# Patient Record
Sex: Male | Born: 1970 | Race: Black or African American | Hispanic: No | Marital: Single | Smoking: Current every day smoker
Health system: Southern US, Community
[De-identification: ages and names within clinical notes are randomized; demographics above are authoritative.]

## PROBLEM LIST (undated history)

## (undated) DIAGNOSIS — Z9359 Other cystostomy status: Secondary | ICD-10-CM

## (undated) DIAGNOSIS — L7682 Other postprocedural complications of skin and subcutaneous tissue: Secondary | ICD-10-CM

## (undated) DIAGNOSIS — F1911 Other psychoactive substance abuse, in remission: Secondary | ICD-10-CM

## (undated) DIAGNOSIS — R339 Retention of urine, unspecified: Secondary | ICD-10-CM

## (undated) DIAGNOSIS — S3730XA Unspecified injury of urethra, initial encounter: Secondary | ICD-10-CM

---

## 1998-02-04 HISTORY — PX: ORCHIECTOMY: SHX2116

## 2002-06-29 ENCOUNTER — Emergency Department (HOSPITAL_COMMUNITY): Admission: EM | Admit: 2002-06-29 | Discharge: 2002-06-29 | Payer: Self-pay | Admitting: Emergency Medicine

## 2003-08-17 ENCOUNTER — Emergency Department (HOSPITAL_COMMUNITY): Admission: EM | Admit: 2003-08-17 | Discharge: 2003-08-18 | Payer: Self-pay | Admitting: Emergency Medicine

## 2008-06-11 ENCOUNTER — Emergency Department (HOSPITAL_BASED_OUTPATIENT_CLINIC_OR_DEPARTMENT_OTHER): Admission: EM | Admit: 2008-06-11 | Discharge: 2008-06-11 | Payer: Self-pay | Admitting: Emergency Medicine

## 2015-03-28 ENCOUNTER — Emergency Department
Admission: EM | Admit: 2015-03-28 | Discharge: 2015-03-28 | Disposition: A | Discharge status: Home / Self Care | Payer: Self-pay | Attending: Emergency Medicine | Admitting: Emergency Medicine

## 2015-03-28 ENCOUNTER — Encounter: Payer: Self-pay | Admitting: Emergency Medicine

## 2015-03-28 DIAGNOSIS — F17200 Nicotine dependence, unspecified, uncomplicated: Secondary | ICD-10-CM | POA: Insufficient documentation

## 2015-03-28 DIAGNOSIS — N390 Urinary tract infection, site not specified: Secondary | ICD-10-CM | POA: Insufficient documentation

## 2015-03-28 LAB — URINALYSIS COMPLETE WITH MICROSCOPIC (ARMC ONLY)
Bilirubin Urine: NEGATIVE
Glucose, UA: NEGATIVE mg/dL
Hgb urine dipstick: NEGATIVE
Ketones, ur: NEGATIVE mg/dL
Nitrite: NEGATIVE
Protein, ur: NEGATIVE mg/dL
Specific Gravity, Urine: 1.024 (ref 1.005–1.030)
pH: 6 (ref 5.0–8.0)

## 2015-03-28 MED ORDER — SULFAMETHOXAZOLE-TRIMETHOPRIM 800-160 MG PO TABS: 1.0000 | ORAL_TABLET | Freq: Two times a day (BID) | ORAL | Status: DC

## 2015-03-28 NOTE — ED Notes (Signed)
Pt to ed from [facility].  States he was told by staff there he had to come her to be evaluated due to bacteria in his urine.  Pt denies all symptoms.

## 2015-03-28 NOTE — ED Provider Notes (Signed)
[facility] Emergency Department Provider Note  ____________________________________________  Time seen: Approximately 10:54 AM  I have reviewed the triage vital signs and the nursing notes.   HISTORY  Chief Complaint pt states he was told he has bacteria in his urine     HPI Francisco Ballard is a 45 y.o. male patient is a resident [facility]. Patient state he was told by staff there is bacteria in his urine. Patient denies any urinary complaints. Patient denies any penial discharge. Patient denies any fever associated this complaint. No palliative measures taken for this complaint. Patient states this been a history of urinary tract infection in the past.   Past Medical History  Diagnosis Date  . Heroin abuse     There are no active problems to display for this patient.   History reviewed. No pertinent past surgical history.  No current outpatient prescriptions on file.  Allergies Review of patient's allergies indicates no known allergies.  History reviewed. No pertinent family history.  Social History Social History  Substance Use Topics  . Smoking status: Current Every Day Smoker  . Smokeless tobacco: None  . Alcohol Use: No    Review of Systems Constitutional: No fever/chills Eyes: No visual changes. ENT: No sore throat. Cardiovascular: Denies chest pain. Respiratory: Denies shortness of breath. Gastrointestinal: No abdominal pain.  No nausea, no vomiting.  No diarrhea.  No constipation. Genitourinary: Negative for dysuria. Musculoskeletal: Negative for back pain. Skin: Negative for rash. Neurological: Negative for headaches, focal weakness or numbness. 10-point ROS otherwise negative.  ____________________________________________   PHYSICAL EXAM:  VITAL SIGNS: ED Triage Vitals  Enc Vitals Group     BP 03/28/15 1016 126/72 mmHg     Pulse Rate 03/28/15 1016 76     Resp 03/28/15 1016 20     Temp 03/28/15 1016 98.2 F (36.8 C)      Temp Source 03/28/15 1016 Oral     SpO2 03/28/15 1016 100 %     Weight 03/28/15 1016 165 lb (74.844 kg)     Height 03/28/15 1016  (1.702 m)     Head Cir --      Peak Flow --      Pain Score 03/28/15 1017 0     Pain Loc --      Pain Edu? --      Excl. in GC? --     Constitutional: Alert and oriented. Well appearing and in no acute distress. Eyes: Conjunctivae are normal. PERRL. EOMI. Head: Atraumatic. Nose: No congestion/rhinnorhea. Mouth/Throat: Mucous membranes are moist.  Oropharynx non-erythematous. Neck: No stridor.  No cervical spine tenderness to palpation. Hematological/Lymphatic/Immunilogical: No cervical lymphadenopathy. Cardiovascular: Normal rate, regular rhythm. Grossly normal heart sounds.  Good peripheral circulation. Respiratory: Normal respiratory effort.  No retractions. Lungs CTAB. Gastrointestinal: Soft and nontender. No distention. No abdominal bruits. No CVA tenderness. Musculoskeletal: No lower extremity tenderness nor edema.  No joint effusions. Neurologic:  Normal speech and language. No gross focal neurologic deficits are appreciated. No gait instability. Skin:  Skin is warm, dry and intact. No rash noted. Psychiatric: Mood and affect are normal. Speech and behavior are normal.  ____________________________________________   LABS (all labs ordered are listed, but only abnormal results are displayed)  Labs Reviewed  URINALYSIS COMPLETEWITH MICROSCOPIC ([facility] ONLY) - Abnormal; Notable for the following:    Color, Urine YELLOW (*)    APPearance CLOUDY (*)    Leukocytes, UA 3+ (*)    Bacteria, UA RARE (*)    Squamous  Epithelial / LPF 0-5 (*)    All other components within normal limits   ____________________________________________  EKG   ____________________________________________  RADIOLOGY   ____________________________________________   PROCEDURES  Procedure(s) performed: None  Critical Care performed:  No  ____________________________________________   INITIAL IMPRESSION / ASSESSMENT AND PLAN / ED COURSE  Pertinent labs & imaging results that were available during my care of the patient were reviewed by me and considered in my medical decision making (see chart for details).  Lab findings consistent urinary tract infection as discussed with patient. Patient will start Bactrim DS and have urine re-tested in 10 days. ____________________________________________   FINAL CLINICAL IMPRESSION(S) / ED DIAGNOSES  Final diagnoses:  UTI (lower urinary tract infection)      Joni Reining, PA-C 03/28/15 1129  Richardean Canal, MD 03/28/15 [phone]

## 2015-04-12 ENCOUNTER — Emergency Department
Admission: EM | Admit: 2015-04-12 | Discharge: 2015-04-12 | Disposition: A | Discharge status: Home / Self Care | Payer: Self-pay | Attending: Emergency Medicine | Admitting: Emergency Medicine

## 2015-04-12 ENCOUNTER — Encounter: Payer: Self-pay | Admitting: Emergency Medicine

## 2015-04-12 DIAGNOSIS — Z09 Encounter for follow-up examination after completed treatment for conditions other than malignant neoplasm: Secondary | ICD-10-CM

## 2015-04-12 DIAGNOSIS — Z0000 Encounter for general adult medical examination without abnormal findings: Secondary | ICD-10-CM | POA: Insufficient documentation

## 2015-04-12 DIAGNOSIS — F17200 Nicotine dependence, unspecified, uncomplicated: Secondary | ICD-10-CM | POA: Insufficient documentation

## 2015-04-12 DIAGNOSIS — Z79899 Other long term (current) drug therapy: Secondary | ICD-10-CM | POA: Insufficient documentation

## 2015-04-12 LAB — URINALYSIS COMPLETE WITH MICROSCOPIC (ARMC ONLY)
BILIRUBIN URINE: NEGATIVE
Bacteria, UA: NONE SEEN
Glucose, UA: NEGATIVE mg/dL
HGB URINE DIPSTICK: NEGATIVE
KETONES UR: NEGATIVE mg/dL
LEUKOCYTES UA: NEGATIVE
NITRITE: NEGATIVE
PH: 7 (ref 5.0–8.0)
PROTEIN: NEGATIVE mg/dL
SPECIFIC GRAVITY, URINE: 1.009 (ref 1.005–1.030)

## 2015-04-12 NOTE — ED Notes (Signed)
See triage

## 2015-04-12 NOTE — ED Notes (Signed)
States he was seen about 10 days ago   With "bacteria" in urine  Was told to f/u after taking antibiotics.. Denies any new sxs'

## 2015-04-12 NOTE — ED Provider Notes (Signed)
[facility] Emergency Department Provider Note  ____________________________________________  Time seen: Approximately 9:56 AM  I have reviewed the triage vital signs and the nursing notes.   HISTORY  Chief Complaint urine recheck     HPI Baldo Hufnagle is a 45 y.o. male , NAD, presents to the emergency department to have his urine rechecked after finishing antibiotics. Was seen in this emergency department 10 days ago with a urinary tract infection. We'll start her on Bactrim DS and asked to have urine retested after completion of medications. Patient denies abdominal pain, dysuria, hematuria, urinary frequency, fever, chills, body aches. Has done well with antibiotics without any side effects.   Past Medical History  Diagnosis Date  . Heroin abuse     There are no active problems to display for this patient.   History reviewed. No pertinent past surgical history.  Current Outpatient Rx  Name  Route  Sig  Dispense  Refill  . sulfamethoxazole-trimethoprim (BACTRIM DS,SEPTRA DS) 800-160 MG tablet   Oral   Take 1 tablet by mouth 2 (two) times daily.   20 tablet   0     Allergies Review of patient's allergies indicates no known allergies.  No family history on file.  Social History Social History  Substance Use Topics  . Smoking status: Current Every Day Smoker  . Smokeless tobacco: None  . Alcohol Use: No     Review of Systems  Constitutional: No fever/chills Cardiovascular: No chest pain. Respiratory:  No shortness of breath. No wheezing.  Gastrointestinal: No abdominal pain.  No nausea, vomiting.  No diarrhea.   Genitourinary: Negative for dysuria. No hematuria. No urinary hesitancy, urgency or increased frequency. Musculoskeletal: Negative for back pain.  Skin: Negative for rash. Neurological: Negative for headaches, focal weakness or numbness. 10-point ROS otherwise  negative.  ____________________________________________   PHYSICAL EXAM:  VITAL SIGNS: ED Triage Vitals  Enc Vitals Group     BP 04/12/15 0945 118/64 mmHg     Pulse Rate 04/12/15 0945 76     Resp 04/12/15 0945 18     Temp 04/12/15 0945 98.3 F (36.8 C)     Temp src --      SpO2 04/12/15 0945 100 %     Weight 04/12/15 0945 170 lb (77.111 kg)     Height 04/12/15 0945  (1.702 m)     Head Cir --      Peak Flow --      Pain Score --      Pain Loc --      Pain Edu? --      Excl. in GC? --     Constitutional: Alert and oriented. Well appearing and in no acute distress. Eyes: Conjunctivae are normal. PERRL. EOMI without pain.  Head: Atraumatic. Neck: No stridor. Supple with FROM Hematological/Lymphatic/Immunilogical: No cervical lymphadenopathy. Cardiovascular: Normal rate, regular rhythm. Normal S1 and S2.  Good peripheral circulation. Respiratory: Normal respiratory effort without tachypnea or retractions. Lungs CTAB. Gastrointestinal: Soft and nontender. No distention. No CVA tenderness. Neurologic:  Normal speech and language. No gross focal neurologic deficits are appreciated.  Skin:  Skin is warm, dry and intact. No rash noted. Psychiatric: Mood and affect are normal. Speech and behavior are normal. Patient exhibits appropriate insight and judgement.   ____________________________________________   LABS (all labs ordered are listed, but only abnormal results are displayed)  Labs Reviewed  URINALYSIS COMPLETEWITH MICROSCOPIC ([facility] ONLY) - Abnormal; Notable for the following:    Color, Urine YELLOW (*)  APPearance CLEAR (*)    Squamous Epithelial / LPF 0-5 (*)    All other components within normal limits   ____________________________________________  EKG  None ____________________________________________  RADIOLOGY  none ____________________________________________    PROCEDURES  Procedure(s) performed: None   Medications - No data to  display   ____________________________________________   INITIAL IMPRESSION / ASSESSMENT AND PLAN / ED COURSE  Pertinent lab results that were available during my care of the patient were reviewed by me and considered in my medical decision making (see chart for details). Significant improvement noted on today's UA as compared to last.   Patient's diagnosis is consistent with resolved UTI. [facility] if symptoms recur as well as to establish for primary care. Patient is given ED precautions to return to the ED for any worsening or new symptoms.    ____________________________________________  FINAL CLINICAL IMPRESSION(S) / ED DIAGNOSES  Final diagnoses:  Follow-up examination      NEW MEDICATIONS STARTED DURING THIS VISIT:  New Prescriptions   No medications on file         Hope PigeonJami L Hagler, PA-C 04/12/15 1039  Arnaldo NatalPaul F Malinda, MD 04/12/15 1526

## 2015-06-28 ENCOUNTER — Ambulatory Visit: Payer: Self-pay

## 2015-06-30 ENCOUNTER — Encounter (INDEPENDENT_AMBULATORY_CARE_PROVIDER_SITE_OTHER): Payer: Self-pay

## 2015-06-30 ENCOUNTER — Ambulatory Visit: Payer: Self-pay

## 2015-10-24 ENCOUNTER — Inpatient Hospital Stay (HOSPITAL_COMMUNITY)
Admission: EM | Admit: 2015-10-24 | Discharge: 2015-10-27 | DRG: 697 | Disposition: A | Discharge status: Home / Self Care | Payer: Self-pay | Attending: Urology | Admitting: Urology

## 2015-10-24 ENCOUNTER — Encounter (HOSPITAL_COMMUNITY): Payer: Self-pay | Admitting: Emergency Medicine

## 2015-10-24 DIAGNOSIS — B9620 Unspecified Escherichia coli [E. coli] as the cause of diseases classified elsewhere: Secondary | ICD-10-CM | POA: Diagnosis present

## 2015-10-24 DIAGNOSIS — F17200 Nicotine dependence, unspecified, uncomplicated: Secondary | ICD-10-CM | POA: Diagnosis present

## 2015-10-24 DIAGNOSIS — S3733XA Laceration of urethra, initial encounter: Secondary | ICD-10-CM | POA: Diagnosis present

## 2015-10-24 DIAGNOSIS — R5381 Other malaise: Secondary | ICD-10-CM | POA: Diagnosis present

## 2015-10-24 DIAGNOSIS — R339 Retention of urine, unspecified: Secondary | ICD-10-CM | POA: Diagnosis present

## 2015-10-24 DIAGNOSIS — N359 Urethral stricture, unspecified: Principal | ICD-10-CM | POA: Diagnosis present

## 2015-10-24 DIAGNOSIS — Y846 Urinary catheterization as the cause of abnormal reaction of the patient, or of later complication, without mention of misadventure at the time of the procedure: Secondary | ICD-10-CM | POA: Diagnosis present

## 2015-10-24 DIAGNOSIS — F1110 Opioid abuse, uncomplicated: Secondary | ICD-10-CM | POA: Diagnosis present

## 2015-10-24 DIAGNOSIS — N390 Urinary tract infection, site not specified: Secondary | ICD-10-CM | POA: Diagnosis present

## 2015-10-24 LAB — URINALYSIS, ROUTINE W REFLEX MICROSCOPIC
BILIRUBIN URINE: NEGATIVE
Glucose, UA: NEGATIVE mg/dL
Ketones, ur: NEGATIVE mg/dL
NITRITE: POSITIVE — AB
PROTEIN: NEGATIVE mg/dL
SPECIFIC GRAVITY, URINE: 1.017 (ref 1.005–1.030)
pH: 6 (ref 5.0–8.0)

## 2015-10-24 LAB — URINE MICROSCOPIC-ADD ON
RBC / HPF: NONE SEEN RBC/hpf (ref 0–5)
SQUAMOUS EPITHELIAL / LPF: NONE SEEN

## 2015-10-24 MED ORDER — LIDOCAINE HCL 2 % EX GEL
1.0000 "application " | Freq: Once | CUTANEOUS | Status: AC
  Administered 2015-10-25: 1 via URETHRAL
  Filled 2015-10-24: qty 11

## 2015-10-24 MED ORDER — CIPROFLOXACIN HCL 500 MG PO TABS
500.0000 mg | ORAL_TABLET | Freq: Once | ORAL | Status: AC
  Administered 2015-10-25: 500 mg via ORAL
  Filled 2015-10-24: qty 1

## 2015-10-24 NOTE — ED Notes (Signed)
Foley attempt unsuccessful.

## 2015-10-24 NOTE — ED Notes (Signed)
Bed: WA03 Expected date:  Expected time:  Means of arrival:  Comments: T4

## 2015-10-24 NOTE — ED Triage Notes (Signed)
Pt c/o inability to urinate today. Pt denies any pain while voiding, but states he is having to "squueze it out." Urine appears cloudy. Pt c/o "backed up feeling in my bladder."

## 2015-10-24 NOTE — ED Provider Notes (Signed)
[facility]-EMERGENCY DEPT Provider Note   CSN: [id number] Arrival date & time: 10/24/15  2059  By signing my name below, I, Majel HomerPeyton Lee, attest that this documentation has been prepared under the direction and in the presence of non-physician practitioner, Antony MaduraKelly Jennyfer Nickolson, PA-C. Electronically Signed: Majel HomerPeyton Lee, Scribe. 10/24/2015. 11:23 PM.   History   Chief Complaint Chief Complaint  Patient presents with  . Urinary Retention   The history is provided by the patient. No language interpreter was used.   HPI Comments: Filomena JunglingChristopher Ortego is a 45 y.o. male with PMHx of heroin abuse, who presents to the Emergency Department complaining of gradually worsening, urinary retention that began this morning and worsened tonight. Pt reports he can urinate but he has to "squeeze it out." He states his urine appears "cloudy." Pt notes the last time he was able to urinate without complication was last night. He denies hematuria, dysuria, nausea, vomiting, penile pain or discharge, possibility of STDs and hx of prostate issues.     Past Medical History:  Diagnosis Date  . Heroin abuse    There are no active problems to display for this patient.  History reviewed. No pertinent surgical history.  Home Medications    Prior to Admission medications   Medication Sig Start Date End Date Taking? Authorizing Provider  sulfamethoxazole-trimethoprim (BACTRIM DS,SEPTRA DS) 800-160 MG tablet Take 1 tablet by mouth 2 (two) times daily. Patient not taking: Reported on 10/24/2015 03/28/15   Joni Reiningonald K Smith, PA-C    Family History No family history on file.  Social History Social History  Substance Use Topics  . Smoking status: Current Every Day Smoker  . Smokeless tobacco: Not on file  . Alcohol use No   Allergies   Review of patient's allergies indicates no known allergies.   Review of Systems Review of Systems  Gastrointestinal: Negative for nausea and vomiting.  Genitourinary: Positive for difficulty  urinating. Negative for discharge, dysuria, hematuria and penile pain.   Physical Exam Updated Vital Signs BP 116/84   Temp 98.2 F (36.8 C)   Resp 16   SpO2 100%   Physical Exam  Constitutional: He is oriented to person, place, and time. He appears well-developed and well-nourished. No distress.  Nontoxic appearing, in NAD  HENT:  Head: Normocephalic and atraumatic.  Eyes: Conjunctivae and EOM are normal. No scleral icterus.  Neck: Normal range of motion.  Pulmonary/Chest: Effort normal. No respiratory distress.  Abdominal: Soft. He exhibits distension.  Soft, distended abdomen with suprapubic TTP.  Genitourinary: Prostate is enlarged. Prostate is not tender. Right testis shows no mass and no tenderness. Left testis shows no mass and no tenderness. Circumcised. Discharge (scant blood from urethral meatus) found.  Genitourinary Comments: Enlarged, nonboggy, nontender prostate  Musculoskeletal: Normal range of motion.  Neurological: He is alert and oriented to person, place, and time.  Skin: Skin is warm and dry. No rash noted. He is not diaphoretic. No erythema. No pallor.  Psychiatric: He has a normal mood and affect. His behavior is normal.  Nursing note and vitals reviewed.    ED Treatments / Results  Labs (all labs ordered are listed, but only abnormal results are displayed) Labs Reviewed  URINALYSIS, ROUTINE W REFLEX MICROSCOPIC (NOT AT [facility]) - Abnormal; Notable for the following:       Result Value   APPearance TURBID (*)    Hgb urine dipstick MODERATE (*)    Nitrite POSITIVE (*)    Leukocytes, UA LARGE (*)    All other  components within normal limits  URINE MICROSCOPIC-ADD ON - Abnormal; Notable for the following:    Bacteria, UA MANY (*)    All other components within normal limits  URINE CULTURE  GC/CHLAMYDIA PROBE AMP ([place]) NOT AT [facility]    EKG  EKG Interpretation None      Radiology No results found.  Procedures Procedures (including critical  care time)  Medications Ordered in ED Medications - No data to display  Initial Impression / Assessment and Plan / ED Course  I have reviewed the triage vital signs and the nursing notes.  Pertinent labs & imaging results that were available during my care of the patient were reviewed by me and considered in my medical decision making (see chart for details).  Clinical Course  DIAGNOSTIC STUDIES:  Oxygen Saturation is 100% on RA, normal by my interpretation.    COORDINATION OF CARE:  11:15 PM Notified by nursing tech of difficulty passing foley catheter; informed that large amount of blood expelled and tech unable to inflate catheter balloon. Per chart review, foley catheter placement was ordered by RN at 2252 prior to my evaluation of the patient.  11:22 PM  Patient evaluated at bedside. He has an enlarged, but nontender prostate. Scant blood noted from urethral meatus secondary to attempted foley placement by nursing tech. Patient c/o lower abdominal discomfort only. Clinical picture c/w retention secondary to UTI. Of note, urine obtained PRIOR to attempted foley placement with evidence of Hgb by dipstick but no RBCs per HPF.  11:57 PM Consult placed to urology for evaluation  12:51 AM Dr. Berneice Heinrich has evaluated the patient at bedside. Notified by Dr. Berneice Heinrich of urethral tear. Patient to go to OR. Charge RN notified and will fill out [facility].  I personally performed the services described in this documentation, which was scribed in my presence. The recorded information has been reviewed and is accurate.   Final Clinical Impressions(s) / ED Diagnoses   Final diagnoses:  UTI (lower urinary tract infection)  Urethral tear, initial encounter    New Prescriptions New Prescriptions   No medications on file      Antony Madura, PA-C 10/25/15 0113    Antony Madura, PA-C 10/25/15 0129    April Palumbo, MD 10/25/15 0134

## 2015-10-25 ENCOUNTER — Emergency Department (HOSPITAL_COMMUNITY): Payer: Self-pay | Admitting: Certified Registered Nurse Anesthetist

## 2015-10-25 ENCOUNTER — Observation Stay (HOSPITAL_COMMUNITY): Payer: Self-pay

## 2015-10-25 ENCOUNTER — Encounter (HOSPITAL_COMMUNITY): Payer: Self-pay | Admitting: Certified Registered Nurse Anesthetist

## 2015-10-25 ENCOUNTER — Encounter (HOSPITAL_COMMUNITY)
Admission: EM | Disposition: A | Discharge status: Home / Self Care | Payer: Self-pay | Source: Home / Self Care | Attending: Urology

## 2015-10-25 DIAGNOSIS — R339 Retention of urine, unspecified: Secondary | ICD-10-CM | POA: Diagnosis present

## 2015-10-25 HISTORY — PX: CYSTOSCOPY WITH URETHRAL DILATATION: SHX5125

## 2015-10-25 LAB — BASIC METABOLIC PANEL
ANION GAP: 8 (ref 5–15)
BUN: 14 mg/dL (ref 6–20)
CALCIUM: 9.5 mg/dL (ref 8.9–10.3)
CO2: 28 mmol/L (ref 22–32)
Chloride: 102 mmol/L (ref 101–111)
Creatinine, Ser: 1.31 mg/dL — ABNORMAL HIGH (ref 0.61–1.24)
GFR calc non Af Amer: >60 mL/min (ref >60)
Glucose, Bld: 112 mg/dL — ABNORMAL HIGH (ref 65–99)
Potassium: 3.8 mmol/L (ref 3.5–5.1)
Sodium: 138 mmol/L (ref 135–145)

## 2015-10-25 LAB — GC/CHLAMYDIA PROBE AMP (~~LOC~~) NOT AT ARMC
CHLAMYDIA, DNA PROBE: NEGATIVE
Neisseria Gonorrhea: NEGATIVE

## 2015-10-25 LAB — CBC
HCT: 45.6 % (ref 39.0–52.0)
HEMOGLOBIN: 15.3 g/dL (ref 13.0–17.0)
MCH: 31.5 pg (ref 26.0–34.0)
MCHC: 33.6 g/dL (ref 30.0–36.0)
MCV: 93.8 fL (ref 78.0–100.0)
Platelets: 238 10*3/uL (ref 150–400)
RBC: 4.86 MIL/uL (ref 4.22–5.81)
RDW: 13.4 % (ref 11.5–15.5)
WBC: 8.5 10*3/uL (ref 4.0–10.5)

## 2015-10-25 SURGERY — CYSTOSCOPY, WITH URETHRAL DILATION
Anesthesia: General | Site: Bladder

## 2015-10-25 MED ORDER — LIDOCAINE 2% (20 MG/ML) 5 ML SYRINGE
INTRAMUSCULAR | Status: AC
  Filled 2015-10-25: qty 5

## 2015-10-25 MED ORDER — ORAL CARE MOUTH RINSE
15.0000 mL | Freq: Two times a day (BID) | OROMUCOSAL | Status: DC
  Administered 2015-10-25 – 2015-10-27 (×2): 15 mL via OROMUCOSAL

## 2015-10-25 MED ORDER — PROPOFOL 10 MG/ML IV BOLUS
INTRAVENOUS | Status: AC
  Filled 2015-10-25: qty 20

## 2015-10-25 MED ORDER — CHLORHEXIDINE GLUCONATE 0.12 % MT SOLN
15.0000 mL | Freq: Two times a day (BID) | OROMUCOSAL | Status: DC
  Administered 2015-10-25 – 2015-10-27 (×5): 15 mL via OROMUCOSAL
  Filled 2015-10-25 (×4): qty 15

## 2015-10-25 MED ORDER — PROPOFOL 10 MG/ML IV BOLUS
INTRAVENOUS | Status: DC | PRN
  Administered 2015-10-25: 200 mg via INTRAVENOUS
  Administered 2015-10-25 (×2): 50 mg via INTRAVENOUS

## 2015-10-25 MED ORDER — HYDROMORPHONE HCL 1 MG/ML IJ SOLN
1.0000 mg | Freq: Once | INTRAMUSCULAR | Status: AC
  Administered 2015-10-25: 1 mg via INTRAMUSCULAR
  Filled 2015-10-25: qty 1

## 2015-10-25 MED ORDER — OXYCODONE-ACETAMINOPHEN 5-325 MG PO TABS
1.0000 | ORAL_TABLET | ORAL | Status: DC | PRN
  Administered 2015-10-25 – 2015-10-26 (×4): 2 via ORAL
  Administered 2015-10-26: 1 via ORAL
  Administered 2015-10-27 (×2): 2 via ORAL
  Filled 2015-10-25 (×2): qty 1
  Filled 2015-10-25 (×6): qty 2

## 2015-10-25 MED ORDER — DEXAMETHASONE SODIUM PHOSPHATE 10 MG/ML IJ SOLN
INTRAMUSCULAR | Status: DC | PRN
  Administered 2015-10-25: 80 mg via INTRAVENOUS

## 2015-10-25 MED ORDER — DEXTROSE 5 % IV SOLN
INTRAVENOUS | Status: AC
  Filled 2015-10-25: qty 2

## 2015-10-25 MED ORDER — SENNOSIDES-DOCUSATE SODIUM 8.6-50 MG PO TABS
1.0000 | ORAL_TABLET | Freq: Two times a day (BID) | ORAL | Status: DC
  Administered 2015-10-25 – 2015-10-27 (×5): 1 via ORAL
  Filled 2015-10-25 (×5): qty 1

## 2015-10-25 MED ORDER — 0.9 % SODIUM CHLORIDE (POUR BTL) OPTIME
TOPICAL | Status: DC | PRN
  Administered 2015-10-25: 1000 mL

## 2015-10-25 MED ORDER — MIDAZOLAM HCL 2 MG/2ML IJ SOLN
INTRAMUSCULAR | Status: AC
  Filled 2015-10-25: qty 2

## 2015-10-25 MED ORDER — FENTANYL CITRATE (PF) 100 MCG/2ML IJ SOLN
INTRAMUSCULAR | Status: AC
  Filled 2015-10-25: qty 2

## 2015-10-25 MED ORDER — HYDROMORPHONE HCL 1 MG/ML IJ SOLN
0.2500 mg | INTRAMUSCULAR | Status: DC | PRN
  Administered 2015-10-25 (×4): 0.5 mg via INTRAVENOUS

## 2015-10-25 MED ORDER — SODIUM CHLORIDE 0.9 % IR SOLN
Status: DC | PRN
  Administered 2015-10-25: 3000 mL

## 2015-10-25 MED ORDER — ACETAMINOPHEN 325 MG PO TABS
650.0000 mg | ORAL_TABLET | Freq: Four times a day (QID) | ORAL | Status: DC | PRN
  Administered 2015-10-26: 650 mg via ORAL
  Filled 2015-10-25: qty 2

## 2015-10-25 MED ORDER — HYDROCODONE-ACETAMINOPHEN 5-325 MG PO TABS
1.0000 | ORAL_TABLET | Freq: Once | ORAL | Status: DC
  Filled 2015-10-25: qty 1

## 2015-10-25 MED ORDER — LACTATED RINGERS IV SOLN
INTRAVENOUS | Status: DC | PRN
  Administered 2015-10-25 (×2): via INTRAVENOUS

## 2015-10-25 MED ORDER — HYDROMORPHONE HCL 1 MG/ML IJ SOLN
INTRAMUSCULAR | Status: AC
  Filled 2015-10-25: qty 1

## 2015-10-25 MED ORDER — ONDANSETRON HCL 4 MG/2ML IJ SOLN
INTRAMUSCULAR | Status: AC
  Filled 2015-10-25: qty 2

## 2015-10-25 MED ORDER — DIPHENHYDRAMINE HCL 25 MG PO CAPS
25.0000 mg | ORAL_CAPSULE | Freq: Three times a day (TID) | ORAL | Status: DC | PRN
Start: 2015-10-25 — End: 2015-10-27
  Administered 2015-10-25 – 2015-10-26 (×2): 25 mg via ORAL
  Filled 2015-10-25 (×2): qty 1

## 2015-10-25 MED ORDER — MEPERIDINE HCL 50 MG/ML IJ SOLN
6.2500 mg | INTRAMUSCULAR | Status: DC | PRN
  Administered 2015-10-25: 12.5 mg via INTRAVENOUS

## 2015-10-25 MED ORDER — LACTATED RINGERS IV SOLN
INTRAVENOUS | Status: DC
  Administered 2015-10-25: 07:00:00 via INTRAVENOUS

## 2015-10-25 MED ORDER — HYDROMORPHONE HCL 1 MG/ML IJ SOLN
0.5000 mg | INTRAMUSCULAR | Status: DC | PRN
  Administered 2015-10-25 (×3): 1 mg via INTRAVENOUS
  Filled 2015-10-25 (×3): qty 1

## 2015-10-25 MED ORDER — SODIUM CHLORIDE 0.9 % IV SOLN
INTRAVENOUS | Status: DC
  Administered 2015-10-25 – 2015-10-26 (×4): via INTRAVENOUS

## 2015-10-25 MED ORDER — MEPERIDINE HCL 50 MG/ML IJ SOLN
INTRAMUSCULAR | Status: AC
  Filled 2015-10-25: qty 1

## 2015-10-25 MED ORDER — IOHEXOL 300 MG/ML  SOLN
INTRAMUSCULAR | Status: DC | PRN
  Administered 2015-10-25: 10 mL

## 2015-10-25 MED ORDER — DEXTROSE 5 % IV SOLN
1.0000 g | INTRAVENOUS | Status: DC
  Administered 2015-10-26 – 2015-10-27 (×2): 1 g via INTRAVENOUS
  Filled 2015-10-25 (×2): qty 10

## 2015-10-25 MED ORDER — PHENYLEPHRINE 40 MCG/ML (10ML) SYRINGE FOR IV PUSH (FOR BLOOD PRESSURE SUPPORT)
PREFILLED_SYRINGE | INTRAVENOUS | Status: AC
  Filled 2015-10-25: qty 10

## 2015-10-25 MED ORDER — ONDANSETRON HCL 4 MG/2ML IJ SOLN
INTRAMUSCULAR | Status: DC | PRN
  Administered 2015-10-25: 4 mg via INTRAVENOUS

## 2015-10-25 MED ORDER — LIDOCAINE 2% (20 MG/ML) 5 ML SYRINGE
INTRAMUSCULAR | Status: DC | PRN
  Administered 2015-10-25: 100 mg via INTRAVENOUS

## 2015-10-25 MED ORDER — PROMETHAZINE HCL 25 MG/ML IJ SOLN: 6.2500 mg | INTRAMUSCULAR | Status: DC | PRN

## 2015-10-25 MED ORDER — FENTANYL CITRATE (PF) 100 MCG/2ML IJ SOLN
INTRAMUSCULAR | Status: DC | PRN
  Administered 2015-10-25 (×4): 50 ug via INTRAVENOUS

## 2015-10-25 MED ORDER — MIDAZOLAM HCL 5 MG/5ML IJ SOLN
INTRAMUSCULAR | Status: DC | PRN
  Administered 2015-10-25: 2 mg via INTRAVENOUS

## 2015-10-25 MED ORDER — SUCCINYLCHOLINE CHLORIDE 200 MG/10ML IV SOSY
PREFILLED_SYRINGE | INTRAVENOUS | Status: DC | PRN
Start: 2015-10-25 — End: 2015-10-25
  Administered 2015-10-25: 100 mg via INTRAVENOUS

## 2015-10-25 MED ORDER — DEXTROSE 5 % IV SOLN
1.0000 g | Freq: Once | INTRAVENOUS | Status: AC
Start: 2015-10-25 — End: 2015-10-25
  Administered 2015-10-25: 1 g via INTRAVENOUS

## 2015-10-25 SURGICAL SUPPLY — 25 items
BAG URINE DRAINAGE (UROLOGICAL SUPPLIES) ×3 IMPLANT
BALLN NEPHROSTOMY (BALLOONS)
BALLOON NEPHROSTOMY (BALLOONS) IMPLANT
CATH FOLEY 2W COUNCIL 20FR 5CC (CATHETERS) IMPLANT
CATH FOLEY 2W COUNCIL 5CC 18FR (CATHETERS) ×3 IMPLANT
CATH INTERMIT  6FR 70CM (CATHETERS) ×3 IMPLANT
CATH ROBINSON RED A/P 14FR (CATHETERS) ×3 IMPLANT
CATH URET 5FR 28IN CONE TIP (BALLOONS)
CATH URET 5FR 70CM CONE TIP (BALLOONS) IMPLANT
CATH X-FORCE N30 NEPHROSTOMY (TUBING) ×3 IMPLANT
CLOTH BEACON ORANGE TIMEOUT ST (SAFETY) ×3 IMPLANT
GLOVE BIO SURGEON STRL SZ7.5 (GLOVE) ×3 IMPLANT
GOWN STRL REUS W/TWL XL LVL3 (GOWN DISPOSABLE) ×3 IMPLANT
GUIDEWIRE .038 (WIRE) IMPLANT
GUIDEWIRE AMPLATZ STIFF 0.35 (WIRE) ×3 IMPLANT
GUIDEWIRE ANG ZIPWIRE 038X150 (WIRE) IMPLANT
GUIDEWIRE STR DUAL SENSOR (WIRE) ×3 IMPLANT
MANIFOLD NEPTUNE II (INSTRUMENTS) IMPLANT
NEEDLE TROCAR 18X15 ECHO (NEEDLE) ×3 IMPLANT
NS IRRIG 1000ML POUR BTL (IV SOLUTION) IMPLANT
PACK CYSTO (CUSTOM PROCEDURE TRAY) ×3 IMPLANT
SUT SILK 2 0 SH (SUTURE) ×3 IMPLANT
SYRINGE IRR TOOMEY STRL 70CC (SYRINGE) ×3 IMPLANT
TUBING INSUFFLATION 10FT LAP (TUBING) IMPLANT
WATER STERILE IRR 3000ML UROMA (IV SOLUTION) ×3 IMPLANT

## 2015-10-25 NOTE — Op Note (Signed)
NAMEHAMPTON, WIXOM          ACCOUNT NO.:  [id number]  MEDICAL RECORD NO.:  [id number]  LOCATION:  1444                         FACILITY:  [facility]  PHYSICIAN:  Sebastian Ache, MD     DATE OF BIRTH:  Feb 28, 1970  DATE OF PROCEDURE: 10/25/2015                              OPERATIVE REPORT   PREOPERATIVE DIAGNOSIS:  Urinary retention, high-grade urethral injury.  POSTOPERATIVE DIAGNOSIS:  Urinary retention, high-grade urethral injury.  PROCEDURES: 1. Cystoscopy. 2. Suprapubic tube placement.  ESTIMATED BLOOD LOSS:  Nil.  COMPLICATION:  None.  SPECIMEN:  None.  FINDINGS: 1. Very high-grade proximal pendulous urethral injury, true lumen not     identifiable. 2. Successful placement of percutaneous suprapubic tube 18-French,     connected to straight drain.  INDICATION:  Francisco Ballard is a 45 year old gentleman with history of prior urinary retention many years ago and what sounds like a prior urethral dilation.  He presented to the emergency room with a prodrome of irritative voiding and was found to be in urinary retention.  There were multiple attempts at catheter placement, which resulted in nonresolution of his retention and new gross hematuria.  Emergent urologic consultation was sought.  I examined the patient.  There was significant concern for stricture plus high-grade urethral injury.  Bedside cystoscopy was performed, which did corroborate very high-grade proximal urethral injuries.  It was felt that urgent operative intervention was then warranted with operative cystoscopy with attempted urethral alignment and/or suprapubic tube.  Informed consent was obtained and placed in the medical record.  PROCEDURE IN DETAIL:  The patient being Francisco Ballard, was verified. Procedure being cysto with possible suprapubic tube placement was confirmed.  Procedure was carried out.  Time-out was performed. Intravenous antibiotics were administered.  General anesthesia  was introduced.  The patient was placed into a low lithotomy position and sterile field was created by prepping and draping the patient's infraumbilical abdomen, and his penis, perineum and proximal thighs using iodine.  Chlorhexidine gluconate was used at his infraumbilical abdomen, which was first clipper shaved.  Cystourethroscopy was performed using a 21-French rigid cystoscope.  Inspection of the distal pendulous urethra was unremarkable.  Inspection of the proximal pendulous urethra revealed a very high-grade urethral injury with urethra blind ending into a hole and corporal tissue.  No urethral lumen was seen whatsoever.  This was carefully inspected, multiple angulations.  Again, no urethral lumen was seen.  Several attempts at probing with Sensor wire were also performed and continuity was unable to be established.  It was clearly felt that the suprapubic tube would be warranted.  An 18-gauge trocar needle was then used to cannulate the urinary bladder at 2 fingerbreadths above the pubis in the midline as verified by efflux of the yellow urine.  A superstiff wire was placed across this and incision was made approximately 2 cm to the level of the skin and subcutaneous fascia and the 30-French balloon dilation apparatus was carefully advanced across this, inflated to pressure of 20 atmospheres.  The sheath was placed across this.  Flexible cystoscopy was performed from above, which did corroborate placement into the urinary bladder.  Several attempts were made using wire probing from above to establish urethral continuity, this was  unsuccessful.  Given the findings, it was clearly felt that suprapubic tube alone would be the safest avenue today.  As such, a new 18-French Council-type catheter was placed over the superstiff working wire at the level of the urinary bladder, 10 mL of sterile water was placed in the balloon.  The sheath was removed.  This irrigated quantitatively with  Toomey syringe.  This was sutured in place with silk at the level of skin and procedure was terminated.  The patient tolerated the procedure well.  There were no immediate periprocedural complications.  The patient was taken to the postanesthesia care unit in stable condition with plan for observation admission.          ______________________________ Sebastian Acheheodore Telma Pyeatt, MD     TM/MEDQ  D:  10/25/2015  T:  10/25/2015  Job:  [id number]

## 2015-10-25 NOTE — Anesthesia Procedure Notes (Signed)
Procedure Name: Intubation Date/Time: 10/25/2015 2:13 AM Performed by: Epimenio SarinJARVELA, Kenner Lewan R Pre-anesthesia Checklist: Patient identified, Emergency Drugs available, Suction available, Patient being monitored and Timeout performed Patient Re-evaluated:Patient Re-evaluated prior to inductionOxygen Delivery Method: Circle system utilized Preoxygenation: Pre-oxygenation with 100% oxygen Intubation Type: IV induction, Cricoid Pressure applied and Rapid sequence Laryngoscope Size: Mac and 3 Grade View: Grade II Tube type: Oral Tube size: 7.5 mm Number of attempts: 1 Airway Equipment and Method: Stylet Placement Confirmation: ETT inserted through vocal cords under direct vision,  positive ETCO2 and breath sounds checked- equal and bilateral Secured at: 23 cm Tube secured with: Tape Dental Injury: Teeth and Oropharynx as per pre-operative assessment

## 2015-10-25 NOTE — ED Notes (Signed)
Call received from OR with instructions to give pt CHG bath and prepare the consent. Informed that peripheral IV would be placed in the OR.

## 2015-10-25 NOTE — Brief Op Note (Signed)
10/24/2015 - 10/25/2015  3:00 AM  PATIENT:  Francisco Ballard  45 y.o. male  PRE-OPERATIVE DIAGNOSIS: Urinary Retention, Urethral Injury  POST-OPERATIVE DIAGNOSIS: Urinary Retention, Urethral Injury  PROCEDURE:  Procedure(s): CYSTOSCOPY, SUPERPUBIC CATH PLACEMENT (N/A)  SURGEON:  Surgeon(s) and Role:    * Sebastian Acheheodore Zephyr Ridley, MD - Primary  PHYSICIAN ASSISTANT:   ASSISTANTS: none   ANESTHESIA:   general  EBL:  Total I/O In: 1000 [I.V.:1000] Out: -   BLOOD ADMINISTERED:none  DRAINS: SP tube to gravity   LOCAL MEDICATIONS USED:  NONE  SPECIMEN:  No Specimen  DISPOSITION OF SPECIMEN:  N/A  COUNTS:  YES  TOURNIQUET:  * No tourniquets in log *  DICTATION: .Other Dictation: Dictation Number [id number]  PLAN OF CARE: Admit for overnight observation  PATIENT DISPOSITION:  PACU - hemodynamically stable.   Delay start of Pharmacological VTE agent (>24hrs) due to surgical blood loss or risk of bleeding: yes
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
## 2015-10-25 NOTE — ED Notes (Signed)
RN will draw blood work and IV line 

## 2015-10-25 NOTE — ED Notes (Signed)
In to place foley cath after previous unsuccessful attempt.  Patient with moderate bleeding from penis from previous attempt-lidocaine jelly inserted and coude #18 and the #16 attempted and also unsuccessful. Patient has strong urge to void and only slight dribble of bloody urine from penis.

## 2015-10-25 NOTE — Progress Notes (Signed)
On-call Urology consulted, Francisco Ballard, S.. Nurse practioner responded as per RN request, as pt was having itching post adm of dIlaudid. TVO for benadryl 25mg  Q8hrs PRN itching was placed.

## 2015-10-25 NOTE — Transfer of Care (Signed)
Immediate Anesthesia Transfer of Care Note  Patient: Francisco Ballard  Procedure(s) Performed: Procedure(s): CYSTOSCOPY, SUPERPUBIC CATH PLACEMENT (N/A)  Patient Location: PACU  Anesthesia Type:General  Level of Consciousness:  sedated, patient cooperative and responds to stimulation  Airway & Oxygen Therapy:Patient Spontanous Breathing and Patient connected to face mask oxgen  Post-op Assessment:  Report given to PACU RN and Post -op Vital signs reviewed and stable  Post vital signs:  Reviewed and stable  Last Vitals:  Vitals:   10/24/15 2152 10/25/15 0126  BP: 116/84 141/85  Pulse:  107  Resp: 16 20  Temp: 36.8 C 37.4 C    Complications: No apparent anesthesia complications

## 2015-10-25 NOTE — Anesthesia Preprocedure Evaluation (Addendum)
Anesthesia Evaluation  Patient identified by MRN, date of birth, ID band Patient awake    Reviewed: Allergy & Precautions, NPO status , Patient's Chart, lab work & pertinent test results  Airway Mallampati: II  TM Distance: >3 FB Neck ROM: Full    Dental  (+) Poor Dentition, Missing   Pulmonary Current Smoker,    Pulmonary exam normal breath sounds clear to auscultation       Cardiovascular negative cardio ROS Normal cardiovascular exam Rhythm:Regular Rate:Normal     Neuro/Psych negative neurological ROS  negative psych ROS   GI/Hepatic negative GI ROS, (+)     substance abuse  IV drug use,   Endo/Other  negative endocrine ROS  Renal/GU negative Renal ROS     Musculoskeletal negative musculoskeletal ROS (+)   Abdominal   Peds  Hematology negative hematology ROS (+)   Anesthesia Other Findings   Reproductive/Obstetrics                            Anesthesia Physical Anesthesia Plan  ASA: II and emergent  Anesthesia Plan: General   Post-op Pain Management:    Induction: Intravenous, Rapid sequence and Cricoid pressure planned  Airway Management Planned: Oral ETT  Additional Equipment:   Intra-op Plan:   Post-operative Plan: Extubation in OR  Informed Consent: I have reviewed the patients History and Physical, chart, labs and discussed the procedure including the risks, benefits and alternatives for the proposed anesthesia with the patient or authorized representative who has indicated his/her understanding and acceptance.   Dental advisory given  Plan Discussed with: CRNA  Anesthesia Plan Comments:        Anesthesia Quick Evaluation

## 2015-10-26 NOTE — Progress Notes (Signed)
1 Day Post-Op  Subjective:  1 - Urinary Retention, High Grade Urethral Injury - s/p urgent SP tube 10/24/17 for urinary retention with urethral injury / stricture.  States he had similar episode in 1990s and "had to wear a bag" for awhile.   2 - Urinary Tract Infection - pyuria / bacteruria / +nitrites on UA from ER 9/19. Denies fevers. UCX GNR / pending, now on emperic rocephin. CT w/o stones / hydro.  Today "[name]" is stable. Still with some malaise but improving.   Objective: Vital signs in last 24 hours: Temp:  [99.3 F (37.4 C)-101 F (38.3 C)] 100.5 F (38.1 C) (09/21 0611) Pulse Rate:  [83-94] 83 (09/21 0611) Resp:  [16-19] 18 (09/21 0611) BP: (105-129)/(63-84) 105/63 (09/21 0611) SpO2:  [97 %-99 %] 97 % (09/21 0611) Last BM Date: 10/24/15  Intake/Output from previous day: 09/20 0701 - 09/21 0700 In: 2510.4 [P.O.:600; I.V.:1860.4; IV Piggyback:50] Out: 4050 [Urine:4050] Intake/Output this shift: Total I/O In: 240 [P.O.:240] Out: -   General appearance: alert, cooperative and appears stated age Eyes: negative Nose: Nares normal. Septum midline. Mucosa normal. No drainage or sinus tenderness. Throat: lips, mucosa, and tongue normal; teeth and gums normal Back: symmetric, no curvature. ROM normal. No CVA tenderness. Resp: non-labored on room air Cardio: Nl rate GI: soft, non-tender; bowel sounds normal; no masses,  no organomegaly Male genitalia: normal, dried blood at meatus. SP tube in place wtih clear urine.  Extremities: extremities normal, atraumatic, no cyanosis or edema Lymph nodes: Cervical, supraclavicular, and axillary nodes normal. Neurologic: Grossly normal  Lab Results:   Recent Labs  10/25/15 0130  WBC 8.5  HGB 15.3  HCT 45.6  PLT 238   BMET  Recent Labs  10/25/15 0130  NA 138  K 3.8  CL 102  CO2 28  GLUCOSE 112*  BUN 14  CREATININE 1.31*  CALCIUM 9.5   PT/INR No results for input(s): LABPROT, INR in the last 72 hours. ABG No  results for input(s): PHART, HCO3 in the last 72 hours.  Invalid input(s): PCO2, PO2  Studies/Results: Ct Abdomen Pelvis Wo Contrast  Result Date: 10/25/2015 CLINICAL DATA:  Urinary tract infection. Urinary retention. High-grade urethral injury. Evaluation for urinary tract calculi and hydronephrosis. EXAM: CT ABDOMEN AND PELVIS WITHOUT CONTRAST TECHNIQUE: Multidetector CT imaging of the abdomen and pelvis was performed following the standard protocol without IV contrast. COMPARISON:  None. FINDINGS: Lower chest: Dependent subsegmental atelectasis is present in the lung bases. No pleural effusion. Hepatobiliary: 1.7 x 1.0 cm low-density lesion in the left hepatic lobe, most likely a cyst. Possible gallbladder sludge without gross wall thickening or pericholecystic inflammatory change. No biliary dilatation. Pancreas: Unremarkable. Spleen: Unremarkable. Adrenals/Urinary Tract: Unremarkable adrenal glands. No hydronephrosis, renal calculi, or ureteral calculi are identified. A suprapubic bladder catheter is in place. There is moderate diffuse bladder wall thickening. A small amount of fluid and nondependent gas are present in the bladder. Stomach/Bowel: Stomach is within normal limits. There is no evidence of bowel obstruction or inflammation. The appendix is unremarkable. Vascular/Lymphatic: Normal caliber of the abdominal aorta. No enlarged lymph nodes identified in the abdomen or pelvis. Reproductive: Unremarkable prostate. Other: No intraperitoneal free fluid. Mild subcutaneous fat stranding adjacent to the suprapubic catheter. Musculoskeletal: Mild disc space narrowing at L3-4 with trace retrolisthesis, likely degenerative. IMPRESSION: 1. No evidence of urinary tract calculi or hydronephrosis. 2. Diffuse bladder wall thickening which may reflect cystitis. Suprapubic catheter in place. Electronically Signed   By: Jolaine ClickAllen  Grady M.D.  On: 10/25/2015 17:30    Anti-infectives: Anti-infectives    Start      Dose/Rate Route Frequency Ordered Stop   10/26/15 0200  cefTRIAXone (ROCEPHIN) 1 g in dextrose 5 % 50 mL IVPB     1 g 100 mL/hr over 30 Minutes Intravenous Every 24 hours 10/25/15 1607     10/25/15 0115  cefTRIAXone (ROCEPHIN) 1 g in dextrose 5 % 50 mL IVPB     1 g 100 mL/hr over 30 Minutes Intravenous  Once 10/25/15 0102 10/25/15 0215   10/24/15 2330  ciprofloxacin (CIPRO) tablet 500 mg     500 mg Oral  Once 10/24/15 2326 10/25/15 0037      Assessment/Plan:  1 - Urinary Retention, High Grade Urethral Injury - reinforced treatment will be multistep process. Will plan on likely outpatient cysto via SP Tube tract in attempt to re-establish urethral alignment.   2 - Urinary Tract Infection - continue rocephin for now pending final CX data. Goal of fever free x 24 hrs and sensitivity data before DC discussed with patient.   [facility], Lavita Pontius 10/26/2015

## 2015-10-26 NOTE — Anesthesia Postprocedure Evaluation (Signed)
Anesthesia Post Note  Patient: Francisco Ballard  Procedure(s) Performed: Procedure(s) (LRB): CYSTOSCOPY, SUPERPUBIC CATH PLACEMENT (N/A)  Patient location during evaluation: PACU Anesthesia Type: General Level of consciousness: sedated and patient cooperative Pain management: pain level controlled Vital Signs Assessment: post-procedure vital signs reviewed and stable Respiratory status: spontaneous breathing Cardiovascular status: stable Anesthetic complications: no     Last Vitals:  Vitals:   10/26/15 0300 10/26/15 0611  BP:  105/63  Pulse:  83  Resp:  18  Temp: (!) 38.3 C (!) 38.1 C    Last Pain:  Vitals:   10/26/15 0611  TempSrc: Oral  PainSc:    Pain Goal: Patients Stated Pain Goal: 2 (10/25/15 1950)               Francisco LoronJohn Calden Ballard

## 2015-10-27 LAB — URINE CULTURE

## 2015-10-27 MED ORDER — CIPROFLOXACIN HCL 500 MG PO TABS: 500.0000 mg | ORAL_TABLET | Freq: Two times a day (BID) | ORAL | 0 refills | Status: DC

## 2015-10-27 MED ORDER — SENNOSIDES-DOCUSATE SODIUM 8.6-50 MG PO TABS: 1.0000 | ORAL_TABLET | Freq: Two times a day (BID) | ORAL | 0 refills | Status: DC

## 2015-10-27 MED ORDER — TRAMADOL HCL 50 MG PO TABS: 50.0000 mg | ORAL_TABLET | Freq: Four times a day (QID) | ORAL | 0 refills | Status: DC | PRN

## 2015-10-27 NOTE — Progress Notes (Addendum)
Patient is A&O x 4, ambulatory. Discharge instructions were reviewed. Pt denied questions, concerns. Pt would not allow change of suprapubic dressing. Suprapubic supplies provided.

## 2015-10-27 NOTE — Discharge Instructions (Signed)
1 - You may have urinary urgency (bladder spasms) and bloody urine on / off with catheter in place. This is normal.  2 - Call MD or go to ER for fever >102, severe pain / nausea / vomiting not relieved by medications, or acute change in medical status Suprapubic Catheter Home Guide A suprapubic catheter is a rubber tube with a tiny balloon on the end. It is used to drain urine from the bladder. This catheter is put in your bladder through a small opening in the lower center part of your abdomen. Suprapubic refers to the area right above your pubic bone. The balloon on the end of the catheter is filled with germ-free (sterile) water. This keeps the catheter from slipping out. When the catheter is in place, your urine will drain into a collection bag. The bag can be put beside your bed at night or attached to your leg during the day. HOW TO CARE FOR YOUR CATHETER  Cleaning your skin  Clean the skin around the catheter opening every day. ? Wash your hands with soap and water. ? Clean the skin around the opening with a clean washcloth and soapy water. Do not pull on the tube. ? Pat the area dry with a clean towel.  Your caregiver may want you to put a bandage (dressing) over the site. Do not use ointment on this area unless your caregiver tells you to. Cleaning the catheter  Ask your caregiver if you need to clean the catheter and how often.  Use only soap and water.  There may be crusts on the catheter. Put hydrogen peroxide on a cotton ball or gauze pad to remove any crust. Emptying the collection bag  You may have a large drainage bag to use at night and a smaller one for daytime. Empty the large bag every 8 hours. Empty the small bag when it is about full.  Keep the drainage bag below the level of the catheter. This keeps urine from flowing backwards.  Hold the bag over the toilet or another container. Release the valve (spigot) at the bottom of the bag. Do not touch the opening of the  spigot. Do not let the opening touch the toilet or container.  Close the spigot tightly when the bag is empty. Cleaning the collection bag  Clean the bag every few days. ? First, wash your hands. ? Disconnect the tubing from the catheter. Replace the used bag with a new bag. Then you can clean the used one. ? Empty the used bag completely. Rinse it out with warm water and soap or fill the bag with water and add 1 teaspoon of vinegar. Let it sit for about 30 minutes. Then drain.  The bag should be completely dry before storing it. Put it inside a plastic bag to keep it clean. Checking everything  Always make sure there are no kinks in the catheter or tubing.  Always make sure there are no leaks in the catheter, tubing, or collection bag. HOW TO CHANGE YOUR CATHETER Sometimes, a caregiver will change your suprapubic catheter. Other times, you may need to change it yourself. This may be the case if you need to wear a catheter for a long time. Usually, they need to be changed every 4 to 6 weeks. Ask your caregiver how often yours should be changed. Your caregiver will help you order the following supplies for home delivery:  Sterile gloves.  Catheters.  Syringes.  Sterile water.  Sterile cleaning solution.  Lubricant.  Drainage bags. Changing your catheter  Drink plenty of fluids before changing the catheter.  Wash your hands with soap and water.  Lie on your back and put on sterile gloves.  Clean the skin around the catheter opening. Use the sterile cleaning solution.  Use a syringe to get the water out of the balloon from the old catheter.  Slowly remove the catheter.  Take off the first pair of gloves, and put on a new pair. Then put lubricant on the tip of the new catheter. Put the new catheter through the opening.  Wait for some urine to start flowing. Then, use the other syringe to fill the balloon with sterile water.  Attach the catheter to your drainage bag.  Make sure the connection is tight. Important warnings  The catheter should come out easily. If it seems stuck, do not pull it.  Call your caregiver right away if you have any trouble while changing the catheter.  When the old catheter is removed, the new one should be put in right away. This is because the opening will close quickly. If you have a problem, go to an emergency clinic right away. RISKS AND COMPLICATIONS  Urine flow can become blocked. This can happen if the catheter or tubes are not working right. A blood clot can also block urine flow.  The catheter might irritate tissue in your body. This can cause bleeding.  The skin near the opening for the catheter may become irritated or infected.  Bacteria may get into your bladder. This can cause a urinary tract infection. HOME CARE INSTRUCTIONS  Take all medicines prescribed by your caregiver. Follow the directions carefully.  Drink 8 glasses of water every day. This produces good urine flow.  Check the skin around your catheter a few times every day. Watch for redness and swelling. Look for any fluids coming out of the opening.  Do not use powder or cream around the catheter opening.  Do not take tub baths or use pools or hot tubs.  Keep all follow-up appointments. SEEK MEDICAL CARE IF:  You leak urine.  Your skin around the catheter becomes red or sore.  Your urine flow slows down.  Your urine gets cloudy or smelly. SEEK IMMEDIATE MEDICAL CARE IF:   You have chills, nausea, or back pain.  You have trouble changing your catheter.  Your catheter comes out.  You have blood in your urine.  You have no urine flow for 1 hour.  You have a fever.  This information is not intended to replace advice given to you by your health care provider. Make sure you discuss any questions you have with your health care provider.  Document Released: 10/09/2010 Document Revised: 06/07/2014 Document Reviewed:  10/09/2010 Elsevier Interactive Patient Education [employer]2016 [employer] Inc[employer]

## 2015-10-27 NOTE — Discharge Summary (Signed)
Physician Discharge Summary  Patient ID: Francisco Ballard: [id number] DOB/AGE: March 16, 1970 45 y.o.  Admit date: 10/24/2015 Discharge date: 10/27/2015  Admission Diagnoses: Urinary Retention with Urethral Injury, Febrile Urinary Tract Infection  Discharge Diagnoses:  Active Problems:   Urinary retention   Discharged Condition: good  Hospital Course:   1 - Urinary Retention, High Grade Urethral Injury - s/p urgent SP tube 10/24/17 for urinary retention with urethral injury / stricture.  States he had similar episode in 1990s and "had to wear a bag" for awhile. SPT continuing as discharge with plan for outpatient re-attempt urethral reaglinment in few weeks.   2 - Febrile Urinary Tract Infection- febrile UTI from pan-sensitive E. Coli. Managed initially on IV Rocephin. Transitioned to PO Cipro at discharge. CT while in house without hydro, stones, or other complicating factors.  At time of discharge afebrile x 24 hrs.    Consults: None  Significant Diagnostic Studies: labs: as per above, CT as per above  Treatments: surgery and antibiotics as per above  Discharge Exam: Blood pressure 120/76, pulse 78, temperature 98.2 F (36.8 C), temperature source Oral, resp. rate 18, height 5\' 7"  (1.702 m), weight 77.9 kg (171 lb 11.2 oz), SpO2 100 %. General appearance: alert, cooperative and appears stated age Head: Normocephalic, without obvious abnormality, atraumatic Eyes: negative Nose: Nares normal. Septum midline. Mucosa normal. No drainage or sinus tenderness. Throat: lips, mucosa, and tongue normal; teeth and gums normal Neck: supple, symmetrical, trachea midline Back: symmetric, no curvature. ROM normal. No CVA tenderness. Resp: non-labored on room air.  Cardio: Nl rate GI: soft, non-tender; bowel sounds normal; no masses,  no organomegaly Male genitalia: normal, SP Tube in place wtih clear yellow urine.  Extremities: extremities normal, atraumatic, no cyanosis or  edema Pulses: 2+ and symmetric Lymph nodes: Cervical, supraclavicular, and axillary nodes normal. Neurologic: Grossly normal  Disposition: 01-Home or Self Care     Medication List    STOP taking these medications   sulfamethoxazole-trimethoprim 800-160 MG tablet Commonly known as:  BACTRIM DS,SEPTRA DS     TAKE these medications   ciprofloxacin 500 MG tablet Commonly known as:  CIPRO Take 1 tablet (500 mg total) by mouth 2 (two) times daily. X 7 days now. Also begin 2 days before next Urology surgery.   senna-docusate 8.6-50 MG tablet Commonly known as:  Senokot-S Take 1 tablet by mouth 2 (two) times daily. While taking pain meds   traMADol 50 MG tablet Commonly known as:  ULTRAM Take 1 tablet (50 mg total) by mouth every 6 (six) hours as needed for moderate pain. Post-operatively        SignedSebastian Ache: Zareena Willis 10/27/2015, 5:58 PM

## 2015-10-30 ENCOUNTER — Other Ambulatory Visit: Payer: Self-pay | Admitting: Urology

## 2015-10-31 ENCOUNTER — Encounter (HOSPITAL_COMMUNITY): Payer: Self-pay | Admitting: Urology

## 2015-11-07 ENCOUNTER — Encounter (HOSPITAL_BASED_OUTPATIENT_CLINIC_OR_DEPARTMENT_OTHER): Payer: Self-pay | Admitting: *Deleted

## 2015-11-07 NOTE — Progress Notes (Signed)
NPO AFTER MN.   ARRIVE AT 1000.  NEEDS ISTAT 8.  MAY TAKE TYLENOL AM DOS W/ SIPS OF WATER .

## 2015-11-10 ENCOUNTER — Ambulatory Visit (HOSPITAL_BASED_OUTPATIENT_CLINIC_OR_DEPARTMENT_OTHER): Payer: Self-pay | Admitting: Anesthesiology

## 2015-11-10 ENCOUNTER — Ambulatory Visit (HOSPITAL_BASED_OUTPATIENT_CLINIC_OR_DEPARTMENT_OTHER)
Admission: RE | Admit: 2015-11-10 | Discharge: 2015-11-10 | Disposition: A | Discharge status: Home / Self Care | Payer: Self-pay | Source: Ambulatory Visit | Attending: Urology | Admitting: Urology

## 2015-11-10 ENCOUNTER — Encounter (HOSPITAL_BASED_OUTPATIENT_CLINIC_OR_DEPARTMENT_OTHER): Payer: Self-pay | Admitting: *Deleted

## 2015-11-10 ENCOUNTER — Encounter (HOSPITAL_BASED_OUTPATIENT_CLINIC_OR_DEPARTMENT_OTHER)
Admission: RE | Disposition: A | Discharge status: Home / Self Care | Payer: Self-pay | Source: Ambulatory Visit | Attending: Urology

## 2015-11-10 DIAGNOSIS — N359 Urethral stricture, unspecified: Secondary | ICD-10-CM | POA: Insufficient documentation

## 2015-11-10 DIAGNOSIS — S3730XA Unspecified injury of urethra, initial encounter: Secondary | ICD-10-CM | POA: Insufficient documentation

## 2015-11-10 DIAGNOSIS — Y846 Urinary catheterization as the cause of abnormal reaction of the patient, or of later complication, without mention of misadventure at the time of the procedure: Secondary | ICD-10-CM | POA: Insufficient documentation

## 2015-11-10 DIAGNOSIS — F17210 Nicotine dependence, cigarettes, uncomplicated: Secondary | ICD-10-CM | POA: Insufficient documentation

## 2015-11-10 DIAGNOSIS — R339 Retention of urine, unspecified: Secondary | ICD-10-CM | POA: Insufficient documentation

## 2015-11-10 HISTORY — DX: Other postprocedural complications of skin and subcutaneous tissue: L76.82

## 2015-11-10 HISTORY — DX: Other psychoactive substance abuse, in remission: F19.11

## 2015-11-10 HISTORY — PX: CYSTOSCOPY WITH URETHRAL DILATATION: SHX5125

## 2015-11-10 HISTORY — DX: Other cystostomy status: Z93.59

## 2015-11-10 HISTORY — DX: Retention of urine, unspecified: R33.9

## 2015-11-10 HISTORY — DX: Unspecified injury of urethra, initial encounter: S37.30XA

## 2015-11-10 SURGERY — CYSTOSCOPY, WITH URETHRAL DILATION
Anesthesia: General | Site: Renal

## 2015-11-10 MED ORDER — PROPOFOL 10 MG/ML IV BOLUS
INTRAVENOUS | Status: AC
  Filled 2015-11-10: qty 20

## 2015-11-10 MED ORDER — DEXAMETHASONE SODIUM PHOSPHATE 10 MG/ML IJ SOLN
INTRAMUSCULAR | Status: AC
  Filled 2015-11-10: qty 1

## 2015-11-10 MED ORDER — ONDANSETRON HCL 4 MG/2ML IJ SOLN
INTRAMUSCULAR | Status: AC
  Filled 2015-11-10: qty 2

## 2015-11-10 MED ORDER — FENTANYL CITRATE (PF) 100 MCG/2ML IJ SOLN
INTRAMUSCULAR | Status: AC
  Filled 2015-11-10: qty 2

## 2015-11-10 MED ORDER — CEFAZOLIN SODIUM-DEXTROSE 2-4 GM/100ML-% IV SOLN
INTRAVENOUS | Status: AC
  Filled 2015-11-10: qty 100

## 2015-11-10 MED ORDER — OXYCODONE HCL 5 MG PO TABS
5.0000 mg | ORAL_TABLET | Freq: Once | ORAL | Status: DC | PRN
  Filled 2015-11-10: qty 1

## 2015-11-10 MED ORDER — OXYCODONE-ACETAMINOPHEN 5-325 MG PO TABS: 1.0000 | ORAL_TABLET | ORAL | 0 refills | Status: AC | PRN

## 2015-11-10 MED ORDER — PROPOFOL 10 MG/ML IV BOLUS
INTRAVENOUS | Status: DC | PRN
  Administered 2015-11-10: 200 mg via INTRAVENOUS

## 2015-11-10 MED ORDER — CEFAZOLIN SODIUM 1 G IJ SOLR
2000.0000 mg | Freq: Once | INTRAMUSCULAR | Status: DC
  Filled 2015-11-10: qty 20

## 2015-11-10 MED ORDER — CEFAZOLIN SODIUM-DEXTROSE 2-3 GM-% IV SOLR
INTRAVENOUS | Status: DC | PRN
  Administered 2015-11-10: 2 g via INTRAVENOUS

## 2015-11-10 MED ORDER — GENTAMICIN SULFATE 40 MG/ML IJ SOLN
5.0000 mg/kg | INTRAMUSCULAR | Status: DC
  Filled 2015-11-10: qty 9.75

## 2015-11-10 MED ORDER — LIDOCAINE 2% (20 MG/ML) 5 ML SYRINGE
INTRAMUSCULAR | Status: AC
  Filled 2015-11-10: qty 5

## 2015-11-10 MED ORDER — LIDOCAINE HCL (CARDIAC) 20 MG/ML IV SOLN
INTRAVENOUS | Status: DC | PRN
  Administered 2015-11-10: 100 mg via INTRAVENOUS

## 2015-11-10 MED ORDER — FENTANYL CITRATE (PF) 100 MCG/2ML IJ SOLN
INTRAMUSCULAR | Status: DC | PRN
  Administered 2015-11-10: 25 ug via INTRAVENOUS
  Administered 2015-11-10: 50 ug via INTRAVENOUS
  Administered 2015-11-10: 25 ug via INTRAVENOUS

## 2015-11-10 MED ORDER — MIDAZOLAM HCL 5 MG/5ML IJ SOLN
INTRAMUSCULAR | Status: DC | PRN
  Administered 2015-11-10: 2 mg via INTRAVENOUS

## 2015-11-10 MED ORDER — ONDANSETRON HCL 4 MG/2ML IJ SOLN
INTRAMUSCULAR | Status: DC | PRN
  Administered 2015-11-10: 4 mg via INTRAVENOUS

## 2015-11-10 MED ORDER — SODIUM CHLORIDE 0.9 % IR SOLN
Status: DC | PRN
  Administered 2015-11-10: 3000 mL

## 2015-11-10 MED ORDER — ONDANSETRON HCL 4 MG/2ML IJ SOLN
4.0000 mg | Freq: Four times a day (QID) | INTRAMUSCULAR | Status: DC | PRN
  Filled 2015-11-10: qty 2

## 2015-11-10 MED ORDER — DEXAMETHASONE SODIUM PHOSPHATE 4 MG/ML IJ SOLN
INTRAMUSCULAR | Status: DC | PRN
  Administered 2015-11-10: 10 mg via INTRAVENOUS

## 2015-11-10 MED ORDER — MIDAZOLAM HCL 2 MG/2ML IJ SOLN
INTRAMUSCULAR | Status: AC
  Filled 2015-11-10: qty 2

## 2015-11-10 MED ORDER — LACTATED RINGERS IV SOLN
INTRAVENOUS | Status: DC
  Administered 2015-11-10: 11:00:00 via INTRAVENOUS
  Filled 2015-11-10: qty 1000

## 2015-11-10 MED ORDER — OXYCODONE HCL 5 MG/5ML PO SOLN
5.0000 mg | Freq: Once | ORAL | Status: DC | PRN
  Filled 2015-11-10: qty 5

## 2015-11-10 MED ORDER — FENTANYL CITRATE (PF) 100 MCG/2ML IJ SOLN
25.0000 ug | INTRAMUSCULAR | Status: DC | PRN
  Filled 2015-11-10: qty 1

## 2015-11-10 SURGICAL SUPPLY — 34 items
BAG DRAIN URO-CYSTO SKYTR STRL (DRAIN) ×3 IMPLANT
BAG URINE DRAINAGE (UROLOGICAL SUPPLIES) IMPLANT
BAG URINE LEG 19OZ MD ST LTX (BAG) IMPLANT
BALLN NEPHROSTOMY (BALLOONS) ×3
BALLOON NEPHROSTOMY (BALLOONS) ×1 IMPLANT
CATH FOLEY 2W COUNCIL 20FR 5CC (CATHETERS) ×3 IMPLANT
CATH FOLEY 2W COUNCIL 5CC 16FR (CATHETERS) IMPLANT
CATH FOLEY 2W COUNCIL 5CC 18FR (CATHETERS) IMPLANT
CATH FOLEY 2WAY  3CC 10FR (CATHETERS)
CATH FOLEY 2WAY 3CC 10FR (CATHETERS) IMPLANT
CATH FOLEY 2WAY SLVR  5CC 18FR (CATHETERS) ×2
CATH FOLEY 2WAY SLVR 5CC 18FR (CATHETERS) ×1 IMPLANT
CATH ROBINSON RED A/P 14FR (CATHETERS) IMPLANT
CLOTH BEACON ORANGE TIMEOUT ST (SAFETY) ×6 IMPLANT
ELECT REM PT RETURN 9FT ADLT (ELECTROSURGICAL) ×3
ELECTRODE REM PT RTRN 9FT ADLT (ELECTROSURGICAL) ×1 IMPLANT
GLOVE BIO SURGEON STRL SZ7 (GLOVE) ×3 IMPLANT
GLOVE BIO SURGEON STRL SZ7.5 (GLOVE) ×3 IMPLANT
GLOVE BIOGEL PI IND STRL 7.0 (GLOVE) ×2 IMPLANT
GLOVE BIOGEL PI INDICATOR 7.0 (GLOVE) ×4
GOWN STRL REUS W/ TWL LRG LVL3 (GOWN DISPOSABLE) ×2 IMPLANT
GOWN STRL REUS W/TWL LRG LVL3 (GOWN DISPOSABLE) ×4
GUIDEWIRE ANG ZIPWIRE 038X150 (WIRE) IMPLANT
GUIDEWIRE STR DUAL SENSOR (WIRE) ×3 IMPLANT
HOLDER FOLEY CATH W/STRAP (MISCELLANEOUS) IMPLANT
KIT ROOM TURNOVER WOR (KITS) ×3 IMPLANT
MANIFOLD NEPTUNE II (INSTRUMENTS) ×3 IMPLANT
NEEDLE HYPO 18GX1.5 BLUNT FILL (NEEDLE) IMPLANT
PACK CYSTO (CUSTOM PROCEDURE TRAY) ×3 IMPLANT
PLUG CATH AND CAP STER (CATHETERS) ×3 IMPLANT
SYR 20CC LL (SYRINGE) IMPLANT
TUBE CONNECTING 12'X1/4 (SUCTIONS)
TUBE CONNECTING 12X1/4 (SUCTIONS) IMPLANT
WATER STERILE IRR 3000ML UROMA (IV SOLUTION) ×3 IMPLANT

## 2015-11-10 NOTE — Brief Op Note (Signed)
11/10/2015  11:30 AM  PATIENT:  Francisco Ballard  45 y.o. male  PRE-OPERATIVE DIAGNOSIS:  URETHRAL INJURY / STERICTURE  POST-OPERATIVE DIAGNOSIS:  URETHRAL INJURY / STERICTURE  PROCEDURE:  Procedure(s): CYSTOSCOPY WITH URETHRA L BALLOON DILATATION/ SUPERPUBIC CATHETER CHANGE (N/A)  SURGEON:  Surgeon(s) and Role:    * Sebastian Acheheodore Ravyn Nikkel, MD - Primary  PHYSICIAN ASSISTANT:   ASSISTANTS: none   ANESTHESIA:   general  EBL:  Total I/O In: 400 [I.V.:400] Out: 100 [Urine:100]  BLOOD ADMINISTERED:none  DRAINS: 64F SP Tube plugged. 55F foley to gravity.    LOCAL MEDICATIONS USED:  NONE  SPECIMEN:  No Specimen  DISPOSITION OF SPECIMEN:  N/A  COUNTS:  YES  TOURNIQUET:  * No tourniquets in log *  DICTATION: .Other Dictation: Dictation Number [id number]  PLAN OF CARE: Discharge to home after PACU  PATIENT DISPOSITION:  PACU - hemodynamically stable.   Delay start of Pharmacological VTE agent (>24hrs) due to surgical blood loss or risk of bleeding: yes

## 2015-11-10 NOTE — Anesthesia Procedure Notes (Signed)
Procedure Name: LMA Insertion Date/Time: 11/10/2015 11:03 AM Performed by: Jessica PriestBEESON, Francisco Shadix C Pre-anesthesia Checklist: Patient identified, Emergency Drugs available, Suction available and Patient being monitored Patient Re-evaluated:Patient Re-evaluated prior to inductionOxygen Delivery Method: Circle system utilized Preoxygenation: Pre-oxygenation with 100% oxygen Intubation Type: IV induction Ventilation: Mask ventilation without difficulty LMA: LMA inserted LMA Size: 4.0 Number of attempts: 1 Airway Equipment and Method: Bite block Placement Confirmation: positive ETCO2 Tube secured with: Tape Dental Injury: Teeth and Oropharynx as per pre-operative assessment

## 2015-11-10 NOTE — OR Nursing (Signed)
Patient arrived to OR 2 with super pubic catheter in place removed during procedure by Dr. Berneice HeinrichManny discarded 100 mls of urine.
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
## 2015-11-10 NOTE — Transfer of Care (Signed)
Immediate Anesthesia Transfer of Care Note  Patient: Francisco JunglingChristopher Ballard  Procedure(s) Performed: Procedure(s) (LRB): CYSTOSCOPY WITH URETHRA L BALLOON DILATATION/ SUPERPUBIC CATHETER CHANGE (N/A)  Patient Location: PACU  Anesthesia Type: General  Level of Consciousness: awake, sedated, patient cooperative and responds to stimulation  Airway & Oxygen Therapy: Patient Spontanous Breathing and Patient connected to face mask oxygen  Post-op Assessment: Report given to PACU RN, Post -op Vital signs reviewed and stable and Patient moving all extremities  Post vital signs: Reviewed and stable  Complications: No apparent anesthesia complications

## 2015-11-10 NOTE — Anesthesia Preprocedure Evaluation (Signed)
Anesthesia Evaluation  Patient identified by MRN, date of birth, ID band Patient awake    Reviewed: Allergy & Precautions, H&P , NPO status , Patient's Chart, lab work & pertinent test results  Airway Mallampati: II   Neck ROM: full    Dental   Pulmonary Current Smoker,    breath sounds clear to auscultation       Cardiovascular negative cardio ROS   Rhythm:regular Rate:Normal     Neuro/Psych    GI/Hepatic   Endo/Other    Renal/GU    H/o urethral injury.  Now has suprapubic catheter.    Musculoskeletal   Abdominal   Peds  Hematology   Anesthesia Other Findings   Reproductive/Obstetrics                             Anesthesia Physical Anesthesia Plan  ASA: II  Anesthesia Plan: General   Post-op Pain Management:    Induction: Intravenous  Airway Management Planned: LMA  Additional Equipment:   Intra-op Plan:   Post-operative Plan:   Informed Consent: I have reviewed the patients History and Physical, chart, labs and discussed the procedure including the risks, benefits and alternatives for the proposed anesthesia with the patient or authorized representative who has indicated his/her understanding and acceptance.     Plan Discussed with: CRNA, Anesthesiologist and Surgeon  Anesthesia Plan Comments:         Anesthesia Quick Evaluation

## 2015-11-10 NOTE — Discharge Instructions (Signed)
°  Post Anesthesia Home Care Instructions  Activity: Get plenty of rest for the remainder of the day. A responsible adult should stay with you for 24 hours following the procedure.  For the next 24 hours, DO NOT: -Drive a car -Advertising copywriterperate machinery -Drink alcoholic beverages -Take any medication unless instructed by your physician -Make any legal decisions or sign important papers.  Meals: Start with liquid foods such as gelatin or soup. Progress to regular foods as tolerated. Avoid greasy, spicy, heavy foods. If nausea and/or vomiting occur, drink only clear liquids until the nausea and/or vomiting subsides. Call your physician if vomiting continues.  Special Instructions/Symptoms: Your throat may feel dry or sore from the anesthesia or the breathing tube placed in your throat during surgery. If this causes discomfort, gargle with warm salt water. The discomfort should disappear within 24 hours.    1 - You may have urinary urgency (bladder spasms) and bloody urine on / off with catheter in place. This is normal.  2 - Call MD or go to ER for fever >102, severe pain / nausea / vomiting not relieved by medications, or acute change in medical status

## 2015-11-13 ENCOUNTER — Encounter (HOSPITAL_BASED_OUTPATIENT_CLINIC_OR_DEPARTMENT_OTHER): Payer: Self-pay | Admitting: Urology

## 2015-11-13 NOTE — Op Note (Signed)
NAME:  [name], Francisco Ballard          ACCOUNT NO.:  [id number]  MEDICAL RECORD NO.:  [id number]  LOCATION:                                FACILITY:  [facility]  PHYSICIAN:  Sebastian Acheheodore Lavonya Hoerner, MD     DATE OF BIRTH:  08-13-1970  DATE OF PROCEDURE: 11/10/2015                                OPERATIVE REPORT   PREOPERATIVE DIAGNOSIS:  High-grade urethral injury.  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  PROCEDURES: 1. Cystoscopy with urethral dilation and catheter placement,     complicated. 2. Suprapubic tube exchange. 3. Intraoperative fluoroscopy.  ESTIMATED BLOOD LOSS:  Nil.  COMPLICATION:  None.  SPECIMEN:  None.  FINDINGS: 1. High-grade distal bulbar urethral stricture approximately 4-French     predilation, 24-French postdilation. 2. Otherwise unremarkable bladder and urethra. 3. Successful placement of 20-French Council catheter after dilation     across the area of stricture into the level of the urinary bladder.     This was confirmed by antegrade cystoscopy. 4. Successful replacement of 18-French suprapubic tube, 10 mL of     sterile water in both balloons.  Suprapubic tube capped, Foley     catheter to straight drain.  INDICATION:  Mr. [name] is a pleasant 45 year old gentleman with what he thinks is prior history of urethral stricture disease.  He presented to the emergency room several weeks ago with urinary retention and attempt of Foley catheterization was performed and unfortunately, a high-grade urethral injury resulted.  Urgent consultation revealed multiple urethral false passages and unable to re-establish continuity at that time; therefore, we underwent an urgent suprapubic tube placement that has now mucosalized.  He cleared infectious parameters.  He now presents for re-attempt at urethral realignment and possible dilation.  Informed consent was obtained and placed in the medical record.  PROCEDURE IN DETAIL:  The  patient being [name], was verified. Procedure being cystourethral dilation was confirmed.  Procedure was carried out.  Time-out was performed.  Intravenous antibiotics were administered.  General LMA anesthesia was introduced.  The patient was placed into a low lithotomy position.  Sterile field was created by prepping and draping the patient's penis, perineum, proximal thighs, infraumbilical abdomen and in-situ suprapubic tube using iodinated contrast.  The suprapubic tube was removed and cystourethroscopy was performed using 21-French rigid cystoscope per urethra.  This revealed a very high-grade apparent stricture at the area of the distal bulbar urethra, there was what appeared to be a true lumen visible fortunately and a 0.038 Zip wire was advanced across this and coiled into what was felt to be the urinary bladder based on spot fluoroscopic images. Antegrade cystoscopy was then performed using 16-French flexible cystoscope via the suprapubic tube tract, which corroborated through and through wire access to the urinary bladder.  This was quite fortunate. As such, the 24-French balloon dilation apparatus was carefully positioned across this using antegrade cystoscopic guidance, this was inflated to pressure of 22 atmospheres, held for 90 seconds and then released.  The cystoscope was then able to traverse this quite easily and there was approximately a 5-mm segment distal bulbar stricture that was white and nonbloody consistent with stricture disease.  Also under antegrade cystoscopic vision, a  new 20-French Council catheter was placed over the working wire to the level of urinary bladder, 10 mL of sterile water was placed in the balloon.  The antegrade cystoscope was removed and a new suprapubic tube was placed, 18-French Council type. This was plugged and the procedure was terminated.  The patient tolerated the procedure well and there were no immediate  periprocedural complications.  The patient was taken to the postanesthesia care unit in stable condition.    ______________________________ Sebastian Ache, MD   ______________________________ Sebastian Ache, MD    TM/MEDQ  D:  11/10/2015  T:  11/11/2015  Job:  [id number]

## 2015-11-13 NOTE — Anesthesia Postprocedure Evaluation (Signed)
Anesthesia Post Note  Patient: Francisco JunglingChristopher Gambrill  Procedure(s) Performed: Procedure(s) (LRB): CYSTOSCOPY WITH URETHRA L BALLOON DILATATION/ SUPERPUBIC CATHETER CHANGE (N/A)  Patient location during evaluation: PACU Anesthesia Type: General Level of consciousness: awake and alert and patient cooperative Pain management: pain level controlled Vital Signs Assessment: post-procedure vital signs reviewed and stable Respiratory status: spontaneous breathing and respiratory function stable Cardiovascular status: stable Anesthetic complications: no    Last Vitals:  Vitals:   11/10/15 1215 11/10/15 1248  BP: 109/77 109/74  Pulse: 73 65  Resp: 16 20  Temp:  36.7 C    Last Pain:  Vitals:   11/13/15 0945  TempSrc:   PainSc: 8                  [name]

## 2016-08-23 ENCOUNTER — Emergency Department (HOSPITAL_COMMUNITY): Payer: Self-pay

## 2016-08-23 ENCOUNTER — Encounter (HOSPITAL_COMMUNITY): Payer: Self-pay | Admitting: Emergency Medicine

## 2016-08-23 ENCOUNTER — Emergency Department (HOSPITAL_COMMUNITY)
Admission: EM | Admit: 2016-08-23 | Discharge: 2016-08-23 | Disposition: A | Discharge status: Home / Self Care | Payer: Self-pay | Attending: Emergency Medicine | Admitting: Emergency Medicine

## 2016-08-23 DIAGNOSIS — N390 Urinary tract infection, site not specified: Secondary | ICD-10-CM | POA: Insufficient documentation

## 2016-08-23 DIAGNOSIS — N451 Epididymitis: Secondary | ICD-10-CM | POA: Insufficient documentation

## 2016-08-23 DIAGNOSIS — N50812 Left testicular pain: Secondary | ICD-10-CM

## 2016-08-23 DIAGNOSIS — F17210 Nicotine dependence, cigarettes, uncomplicated: Secondary | ICD-10-CM | POA: Insufficient documentation

## 2016-08-23 LAB — URINALYSIS, ROUTINE W REFLEX MICROSCOPIC
Bilirubin Urine: NEGATIVE
GLUCOSE, UA: NEGATIVE mg/dL
Ketones, ur: NEGATIVE mg/dL
Nitrite: POSITIVE — AB
PH: 5 (ref 5.0–8.0)
Protein, ur: NEGATIVE mg/dL
Specific Gravity, Urine: 1.005 (ref 1.005–1.030)

## 2016-08-23 MED ORDER — DEXTROSE 5 % IV SOLN
1.0000 g | Freq: Once | INTRAVENOUS | Status: AC
  Administered 2016-08-23: 1 g via INTRAVENOUS
  Filled 2016-08-23: qty 10

## 2016-08-23 MED ORDER — HYDROMORPHONE HCL 1 MG/ML IJ SOLN
0.5000 mg | Freq: Once | INTRAMUSCULAR | Status: AC
  Administered 2016-08-23: 0.5 mg via INTRAVENOUS
  Filled 2016-08-23: qty 1

## 2016-08-23 MED ORDER — OXYCODONE HCL 5 MG PO TABS: 2.5000 mg | ORAL_TABLET | ORAL | 0 refills | Status: AC | PRN

## 2016-08-23 MED ORDER — ONDANSETRON HCL 4 MG/2ML IJ SOLN
4.0000 mg | Freq: Once | INTRAMUSCULAR | Status: AC
  Administered 2016-08-23: 4 mg via INTRAVENOUS
  Filled 2016-08-23: qty 2

## 2016-08-23 MED ORDER — HYDROMORPHONE HCL 1 MG/ML IJ SOLN
0.5000 mg | Freq: Once | INTRAMUSCULAR | Status: AC
  Administered 2016-08-23: 0.5 mg via INTRAVENOUS
  Filled 2016-08-23: qty 0.5

## 2016-08-23 MED ORDER — MELOXICAM 15 MG PO TABS: 15.0000 mg | ORAL_TABLET | Freq: Every day | ORAL | 0 refills | Status: AC

## 2016-08-23 MED ORDER — CIPROFLOXACIN HCL 500 MG PO TABS
500.0000 mg | ORAL_TABLET | Freq: Two times a day (BID) | ORAL | 0 refills | Status: DC
Start: 2016-08-23 — End: 2016-08-27

## 2016-08-23 NOTE — Discharge Instructions (Signed)
Follow these instructions at home: Medicines Take over-the-counter and prescription medicines only as told by your health care provider. If you were prescribed an antibiotic medicine, take it as told by your health care provider. Do not stop taking the antibiotic even if your condition improves. Sexual Activity If your epididymitis was caused by an STD, avoid sexual activity until your treatment is complete. Inform your sexual partner or partners if you test positive for an STD. They may need to be treated. Do not engage in sexual activity with your partner or partners until their treatment is completed. General instructions Return to your normal activities as told by your health care provider. Ask your health care provider what activities are safe for you. Keep your scrotum elevated and supported while resting. Ask your health care provider if you should wear a scrotal support, such as a jockstrap. Wear it as told by your health care provider. If directed, apply ice to the affected area: Put ice in a plastic bag. Place a towel between your skin and the bag. Leave the ice on for 20 minutes, 2-3 times per day. Try taking a sitz bath to help with discomfort. This is a warm water bath that is taken while you are sitting down. The water should only come up to your hips and should cover your buttocks. Do this 3-4 times per day or as told by your health care provider. Keep all follow-up visits as told by your health care provider. This is important. Contact a health care provider if: You have a fever. Your pain medicine is not helping. Your pain is getting worse. Your symptoms do not improve within three days.
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
## 2016-08-23 NOTE — ED Notes (Signed)
Patient transported to Ultrasound 
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
## 2016-08-24 LAB — RPR: RPR: NONREACTIVE

## 2016-08-24 LAB — HIV ANTIBODY (ROUTINE TESTING W REFLEX): HIV SCREEN 4TH GENERATION: NONREACTIVE

## 2016-08-25 LAB — URINE CULTURE: Culture: >=100000 — AB

## 2016-08-26 ENCOUNTER — Telehealth: Payer: Self-pay | Admitting: Emergency Medicine

## 2016-08-26 LAB — GC/CHLAMYDIA PROBE AMP (~~LOC~~) NOT AT ARMC
Chlamydia: NEGATIVE
NEISSERIA GONORRHEA: NEGATIVE

## 2016-08-26 NOTE — Telephone Encounter (Signed)
Post ED Visit - Positive Culture Follow-up: Successful Patient Follow-Up  Culture assessed and recommendations reviewed by: []  Enzo BiNathan Batchelder, Pharm.D. []  Celedonio MiyamotoJeremy Frens, Pharm.D., BCPS AQ-ID []  Garvin FilaMike Maccia, Pharm.D., BCPS []  Georgina PillionElizabeth Martin, Pharm.D., BCPS []  LaramieMinh Pham, VermontPharm.D., BCPS, AAHIVP []  Estella HuskMichelle Turner, Pharm.D., BCPS, AAHIVP []  Lysle Pearlachel Rumbarger, PharmD, BCPS []  Casilda Carlsaylor Stone, PharmD, BCPS []  Pollyann SamplesAndy Johnston, PharmD, BCPS Sharin MonsEmily Sinclair Pharm D  Positive urine culture  []  Patient discharged without antimicrobial prescription and treatment is now indicated []  Organism is resistant to prescribed ED discharge antimicrobial []  Patient with positive blood cultures  Changes discussed with ED provider: Sharen Hecklaudia Gibbons PA New antibiotic prescription symptom check, if + start Augmentin 875 mg bid x 10 days  Attempting to contact patient   Berle MullMiller, Elisabeth Strom 08/26/2016, 12:12 PM

## 2016-08-26 NOTE — Progress Notes (Signed)
ED Antimicrobial Stewardship Positive Culture Follow Up   Filomena JunglingChristopher Schriner is an 46 y.o. male who [facility] with a chief complaint of  Chief Complaint  Patient presents with  . Testicle Pain    Recent Results (from the past 720 hour(s))  Urine Culture     Status: Abnormal   Collection Time: 08/23/16 12:38 PM  Result Value Ref Range Status   Specimen Description URINE, CLEAN CATCH  Final   Special Requests NONE  Final   Culture >=100,000 COLONIES/mL ESCHERICHIA COLI (A)  Final   Report Status 08/25/2016 FINAL  Final   Organism ID, Bacteria ESCHERICHIA COLI (A)  Final      Susceptibility   Escherichia coli - MIC*    AMPICILLIN <=2 SENSITIVE Sensitive     CEFAZOLIN <=4 SENSITIVE Sensitive     CEFTRIAXONE <=1 SENSITIVE Sensitive     CIPROFLOXACIN >=4 RESISTANT Resistant     GENTAMICIN <=1 SENSITIVE Sensitive     IMIPENEM <=0.25 SENSITIVE Sensitive     NITROFURANTOIN <=16 SENSITIVE Sensitive     TRIMETH/SULFA <=20 SENSITIVE Sensitive     AMPICILLIN/SULBACTAM <=2 SENSITIVE Sensitive     PIP/TAZO <=4 SENSITIVE Sensitive     Extended ESBL NEGATIVE Sensitive     * >=100,000 COLONIES/mL ESCHERICHIA COLI    [x]  Treated with ciprofloxacin, organism resistant to prescribed antimicrobial   New antibiotic prescription: Call patient. Augmentin 875 BID X 10 days if symptomatic   ED Provider: Sharen Hecklaudia Gibbons, PA-C  Della GooEmily S Admir Candelas, PharmD 08/26/2016, 9:50 AM PGY2 Infectious Diseases Pharmacy Resident Phone# [phone]

## 2016-08-27 ENCOUNTER — Encounter (HOSPITAL_COMMUNITY): Payer: Self-pay

## 2016-08-27 ENCOUNTER — Emergency Department (HOSPITAL_COMMUNITY)
Admission: EM | Admit: 2016-08-27 | Discharge: 2016-08-27 | Disposition: A | Discharge status: Home / Self Care | Payer: Self-pay | Attending: Physician Assistant | Admitting: Physician Assistant

## 2016-08-27 DIAGNOSIS — N451 Epididymitis: Secondary | ICD-10-CM | POA: Insufficient documentation

## 2016-08-27 DIAGNOSIS — F17210 Nicotine dependence, cigarettes, uncomplicated: Secondary | ICD-10-CM | POA: Insufficient documentation

## 2016-08-27 MED ORDER — SULFAMETHOXAZOLE-TRIMETHOPRIM 800-160 MG PO TABS: 1.0000 | ORAL_TABLET | Freq: Two times a day (BID) | ORAL | 0 refills | Status: AC

## 2016-08-27 NOTE — Discharge Instructions (Signed)
You have been seen today for a revision to your antibiotics and for an updated work note.  Stop taking the ciprofloxacin antibiotic. Begin taking the Bactrim. Follow up with urology should symptoms persist.
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
## 2016-08-27 NOTE — ED Notes (Signed)
Pt seen 08-23-16 has been taking Cipro, Pt reports only slight improvement in pain when urinates.  NO new fevers.  Pt changed phone # with registration desk today as someone from [facility] attempted to call him yesterday re: antibiotics.

## 2016-08-27 NOTE — ED Triage Notes (Signed)
Per Pt, Pt is coming from home with request of a work note. Pt reports that he was diagnosed with epididymitis last Friday. He has been taking antibiotics. Pt reports the noted said he could go back to work on Monday, but he is still in pain. Reports he is getting better, but he cannot work at this time.

## 2016-12-30 ENCOUNTER — Telehealth: Payer: Self-pay | Admitting: Emergency Medicine

## 2016-12-30 NOTE — Telephone Encounter (Signed)
Lost to followup 

## 2017-09-05 IMAGING — CT CT ABD-PELV W/O CM
2 of 3 series · 17 of 46 positions shown, 19 images · non-contrast
Comparison: None.

CLINICAL DATA: Urinary tract infection. Urinary retention.
High-grade urethral injury. Evaluation for urinary tract calculi and
hydronephrosis.

EXAM:
CT ABDOMEN AND PELVIS WITHOUT CONTRAST
TECHNIQUE: Multidetector CT imaging of the abdomen and pelvis was performed
following the standard protocol without IV contrast.

[Series 4: lung windows · axial · 0.74mm/px · z∈[-138,-48]mm · 14 of 53 slices shown, 16 images]
[im 4/53  soft-tissue]
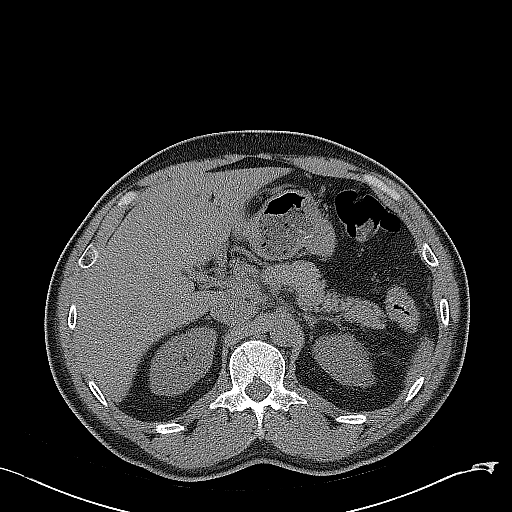
[im 4/53  bone]
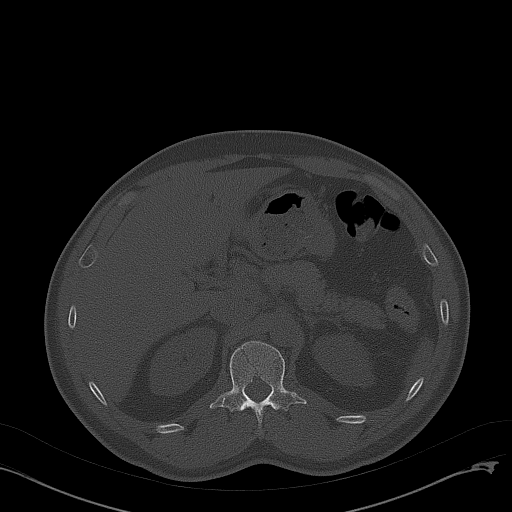
[im 7/53  soft-tissue]
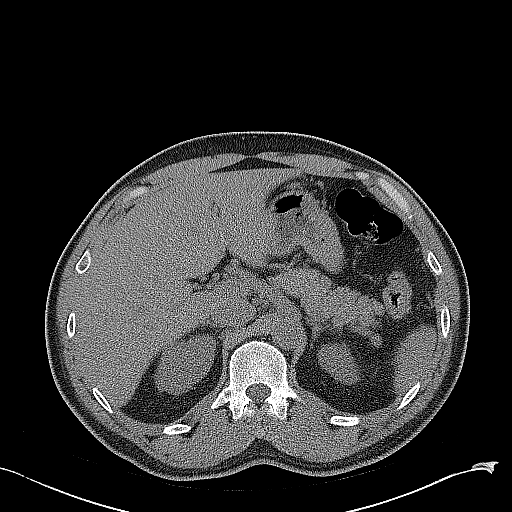
[im 11/53  soft-tissue]
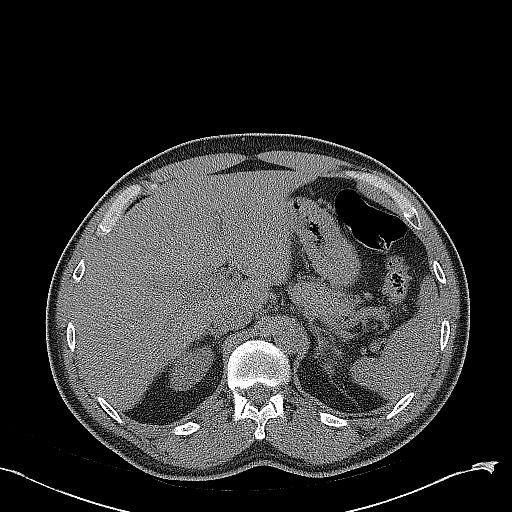
[im 14/53  soft-tissue]
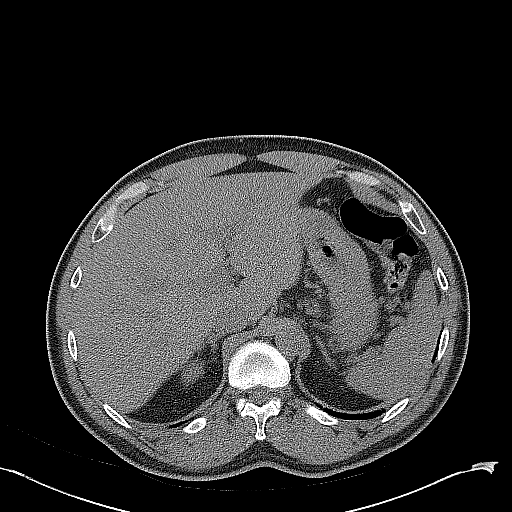
[im 17/53  soft-tissue]
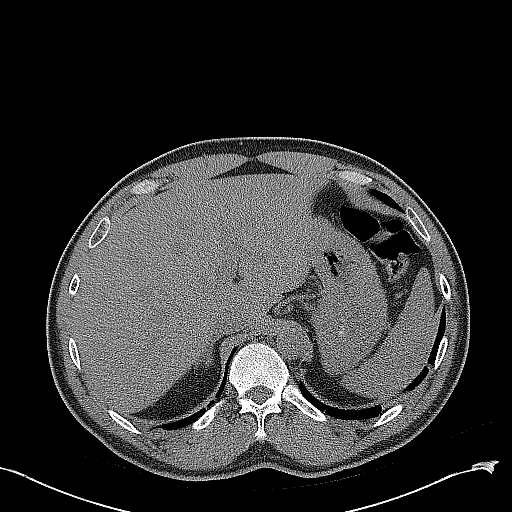
[im 21/53  soft-tissue]
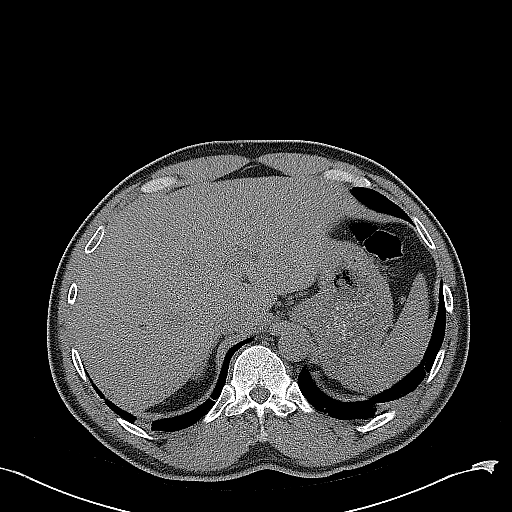
[im 24/53  soft-tissue]
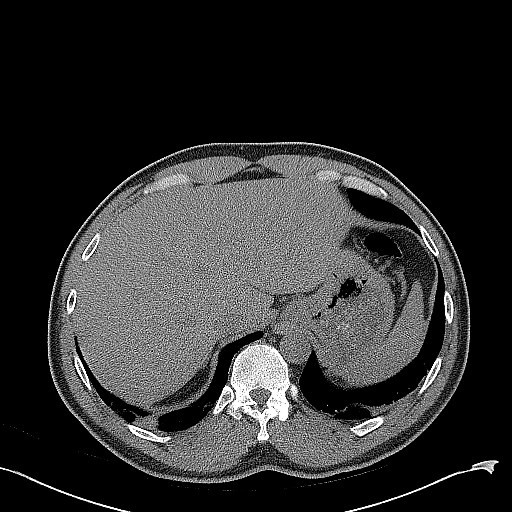
[im 29/53  soft-tissue]
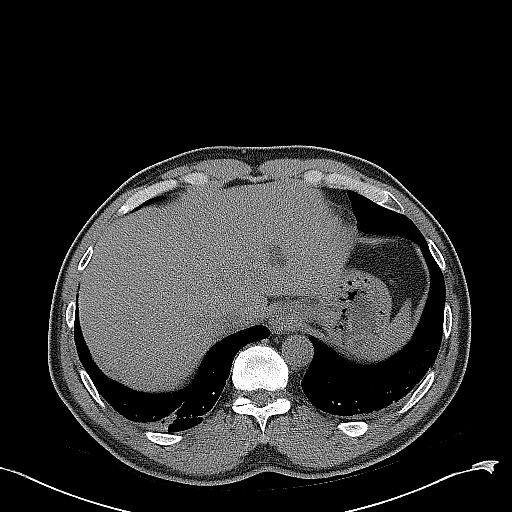
[im 32/53  soft-tissue]
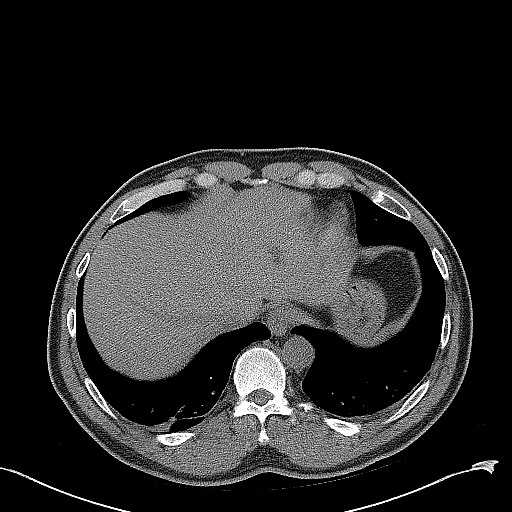
[im 32/53  bone]
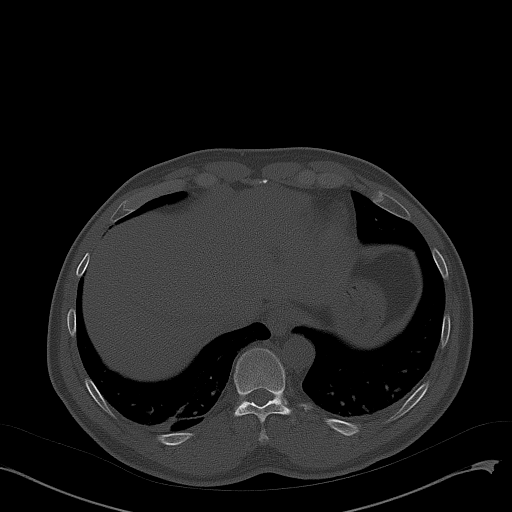
[im 36/53  soft-tissue]
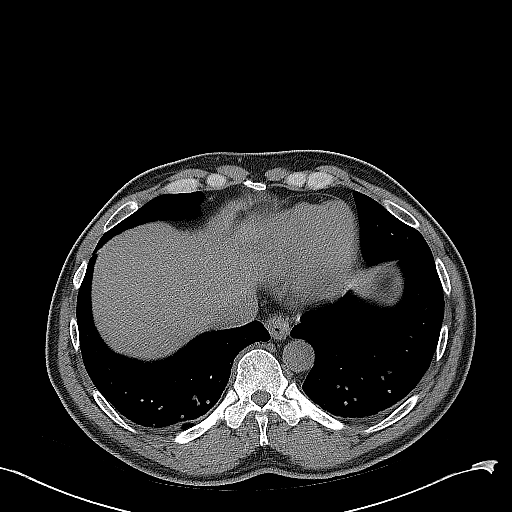
[im 39/53  soft-tissue]
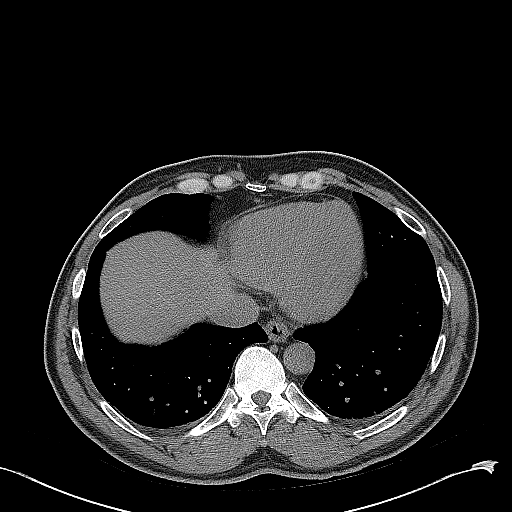
[im 42/53  soft-tissue]
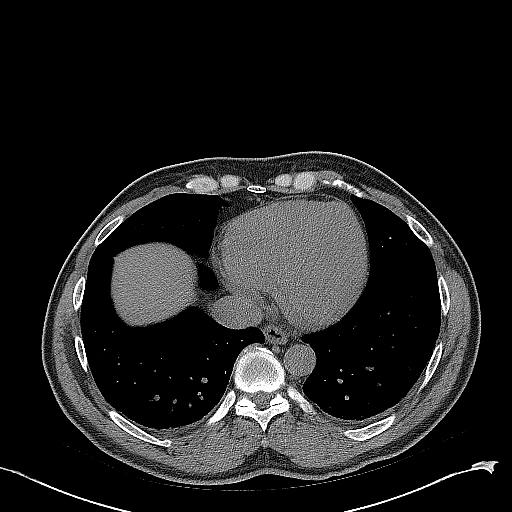
[im 46/53  soft-tissue]
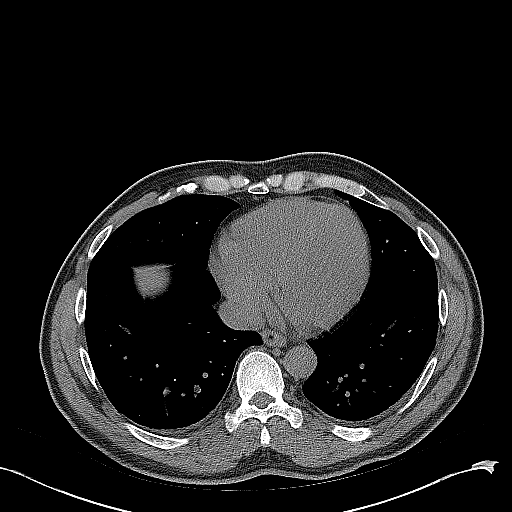
[im 49/53  soft-tissue]
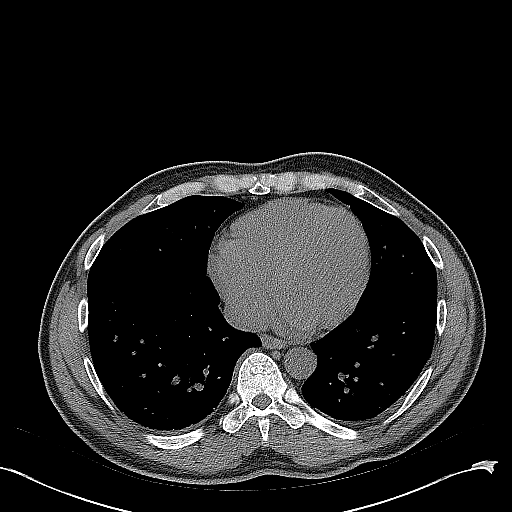

[Series 602: cor · coronal · 0.84mm/px · 3 of 118 slices shown]
[im 40/118  soft-tissue]
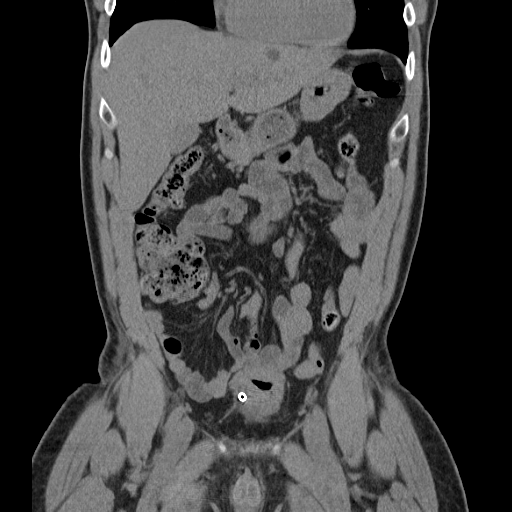
[im 53/118  soft-tissue]
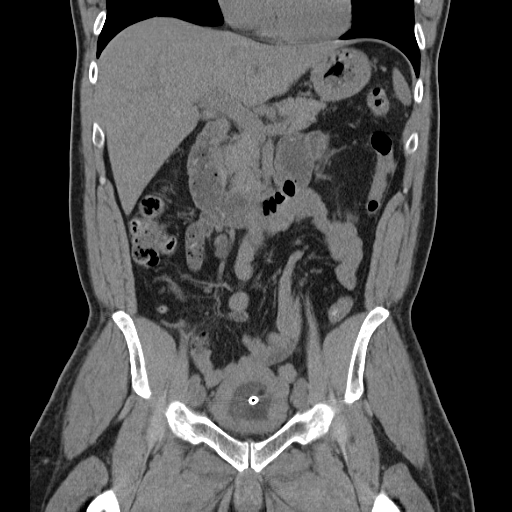
[im 66/118  soft-tissue]
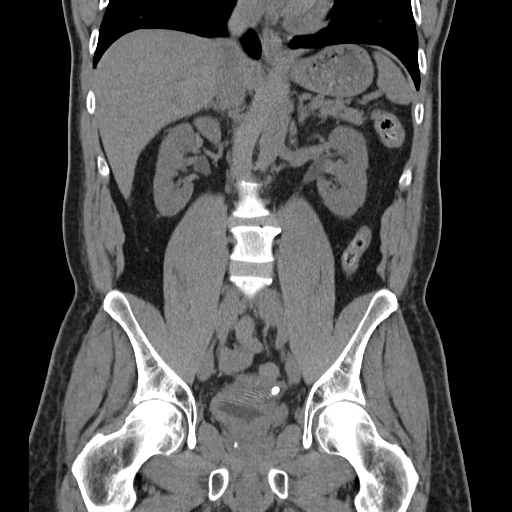

[17 of 46 positions shown; findings below may reference images not displayed]

FINDINGS: Lower chest: Dependent subsegmental atelectasis is present in the
lung bases. No pleural effusion.

Hepatobiliary: 1.7 x 1.0 cm low-density lesion in the left hepatic
lobe, most likely a cyst. Possible gallbladder sludge without gross
wall thickening or pericholecystic inflammatory change. No biliary
dilatation.

Pancreas: Unremarkable.

Spleen: Unremarkable.

Adrenals/Urinary Tract: Unremarkable adrenal glands. No
hydronephrosis, renal calculi, or ureteral calculi are identified. A
suprapubic bladder catheter is in place. There is moderate diffuse
bladder wall thickening. A small amount of fluid and nondependent
gas are present in the bladder.

Stomach/Bowel: Stomach is within normal limits. There is no evidence
of bowel obstruction or inflammation. The appendix is unremarkable.

Vascular/Lymphatic: Normal caliber of the abdominal aorta. No
enlarged lymph nodes identified in the abdomen or pelvis.

Reproductive: Unremarkable prostate.

Other: No intraperitoneal free fluid. Mild subcutaneous fat
stranding adjacent to the suprapubic catheter.

Musculoskeletal: Mild disc space narrowing at L3-4 with trace
retrolisthesis, likely degenerative.
IMPRESSION: 1. No evidence of urinary tract calculi or hydronephrosis.
2. Diffuse bladder wall thickening which may reflect cystitis.
Suprapubic catheter in place.

## 2019-03-28 IMAGING — US US ART/VEN ABD/PELV/SCROTUM DOPPLER LTD
1 series · 13 of 25 positions shown · non-contrast
Comparison: None.

CLINICAL DATA: Pain left scrotum

EXAM:
SCROTAL ULTRASOUND
DOPPLER ULTRASOUND OF THE TESTICLES
TECHNIQUE: Complete ultrasound examination of the testicles, epididymis, and
other scrotal structures was performed. Color and spectral Doppler
ultrasound were also utilized to evaluate blood flow to the
testicles.

[Series 1: us art/ven abd/pelv/scrotum doppler ltd · 0.09mm/px · 37 acquisitions, 13 frames shown]
[im 1/37]
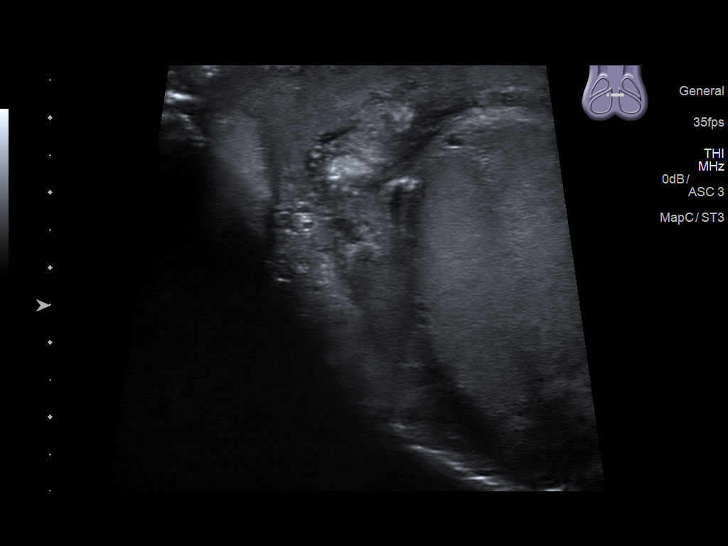
[im 4/37]
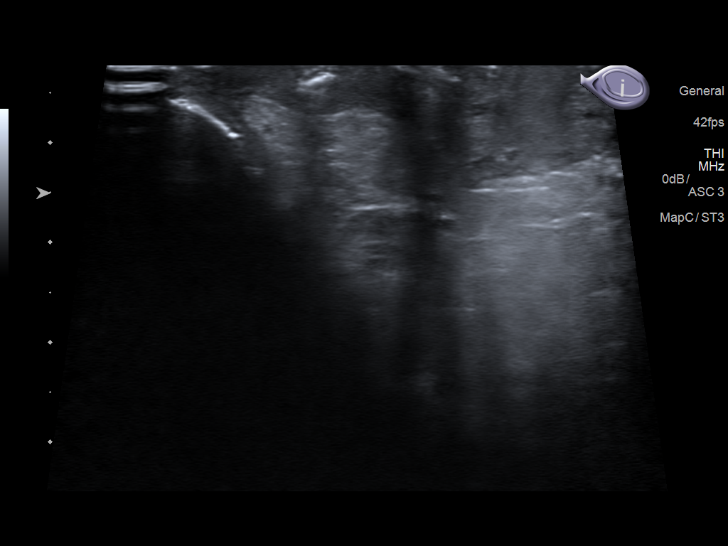
[im 7/37]
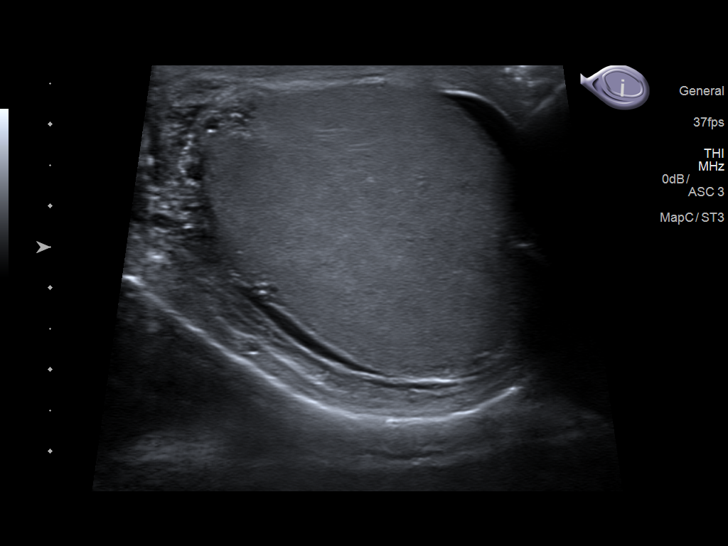
[im 10/37]
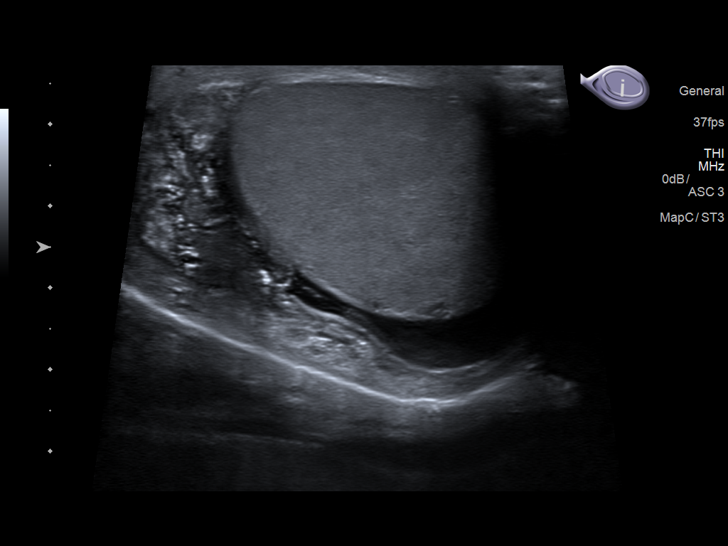
[im 13/37]
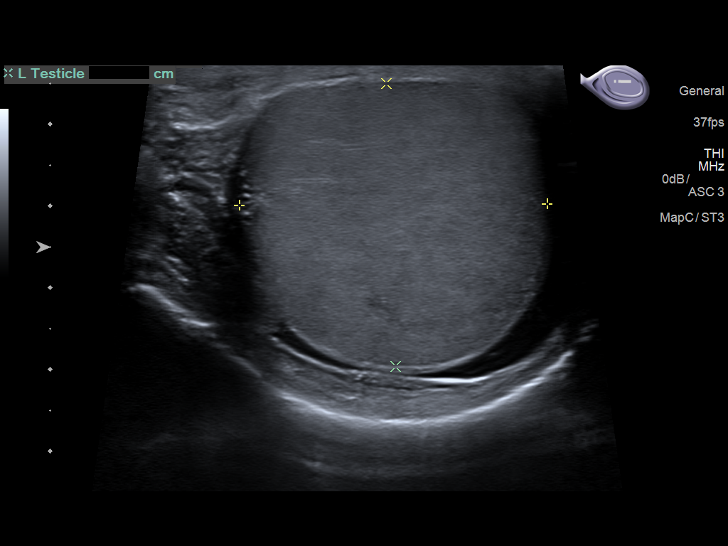
[im 16/37]
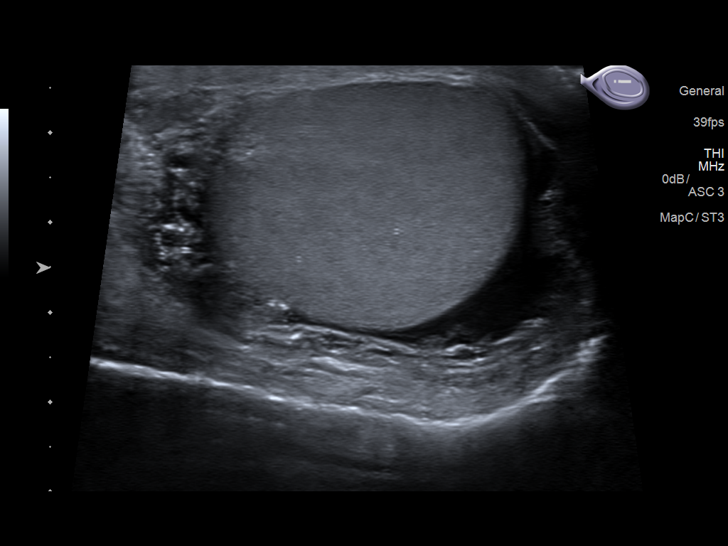
[im 19/37]
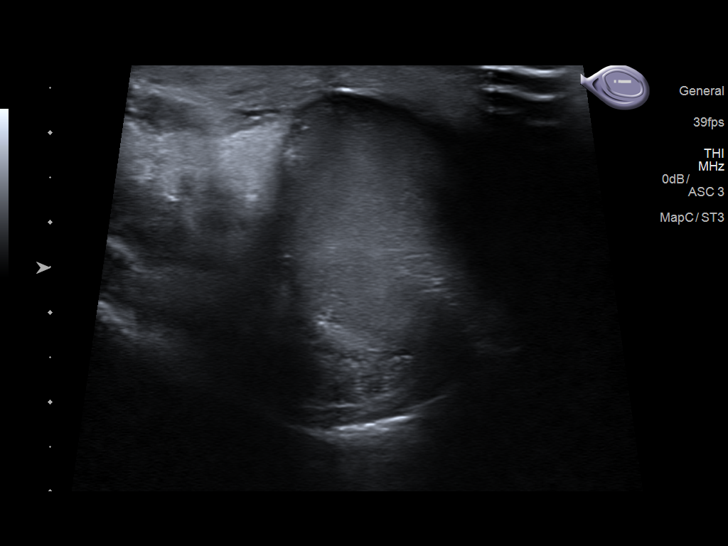
[im 22/37]
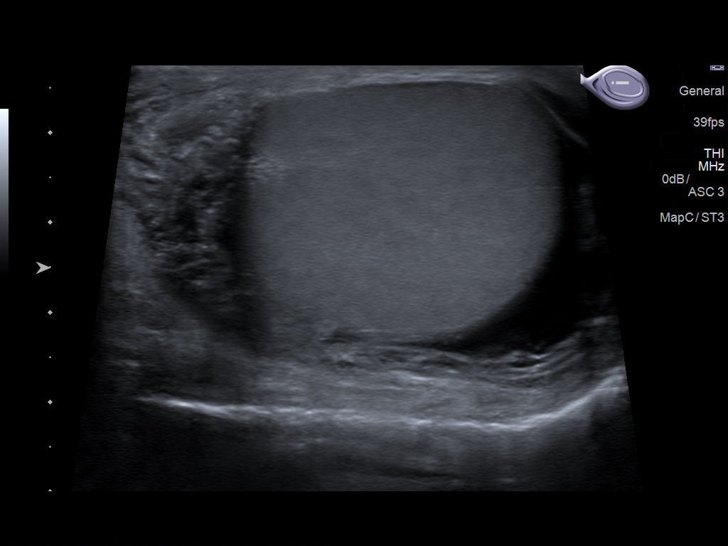
[im 25/37]
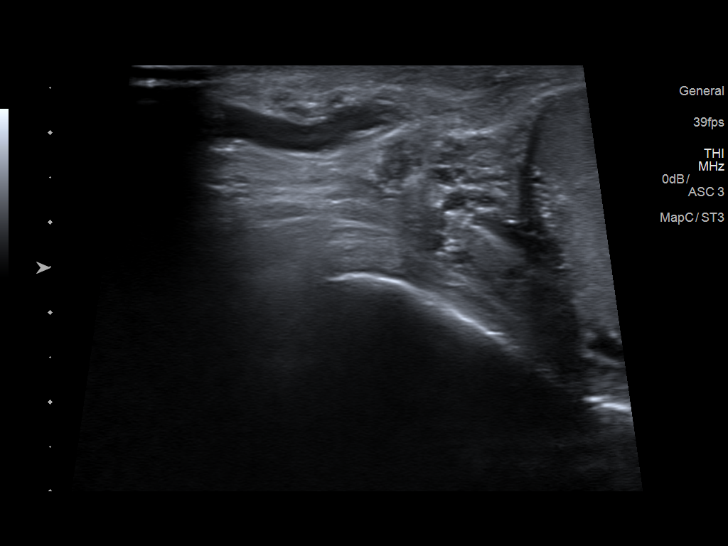
[im 28/37]
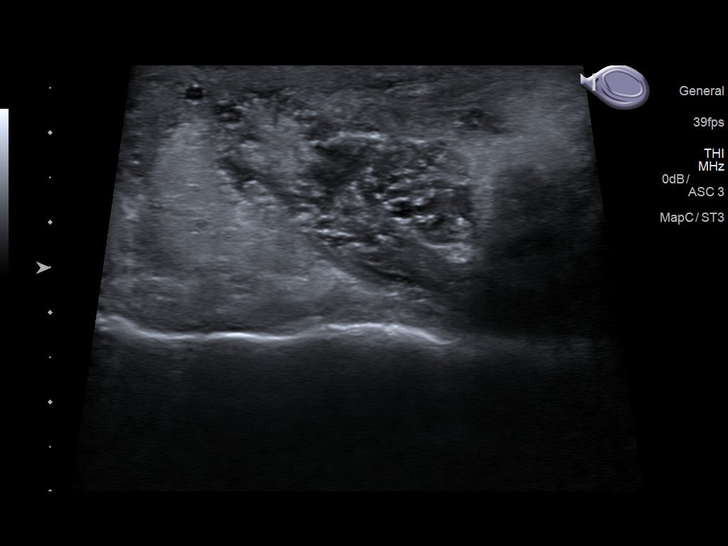
[im 31/37]
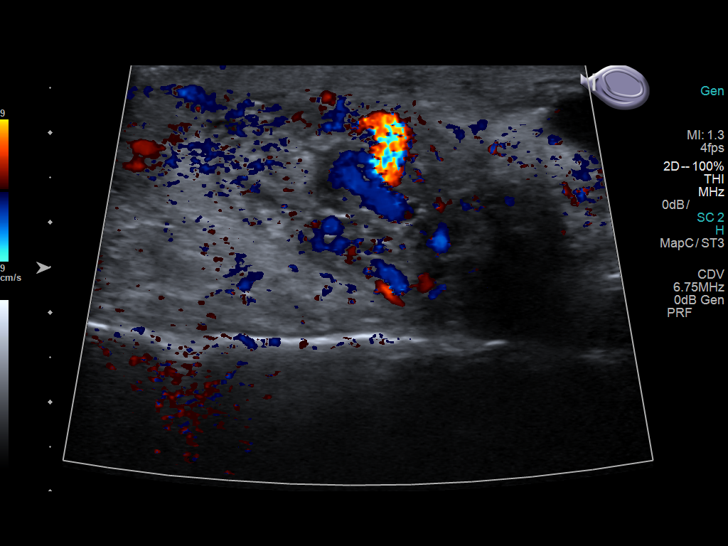
[im 34/37]
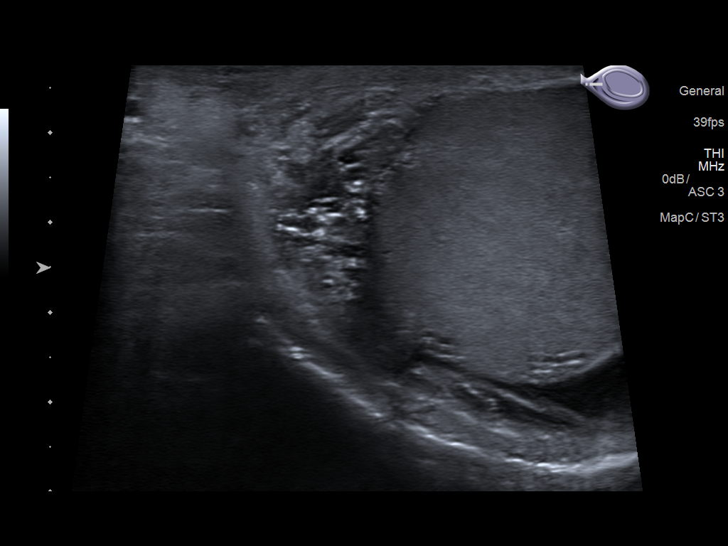
[im 37/37]
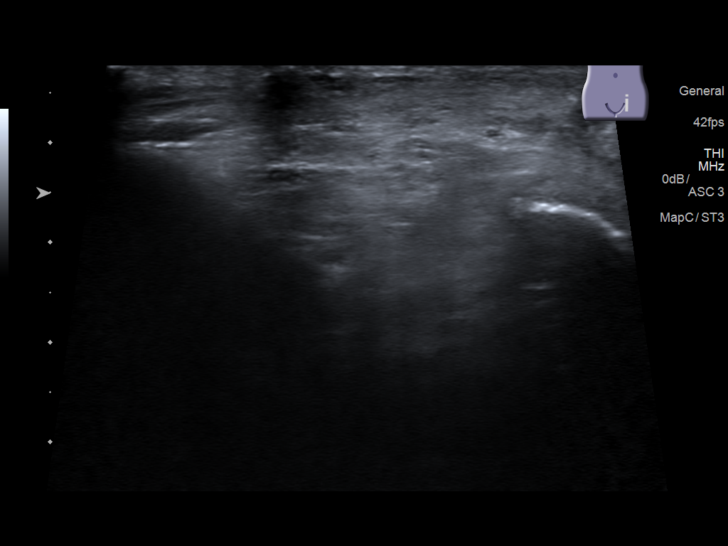

[13 of 25 positions shown; findings below may reference images not displayed]

FINDINGS: Right testicle

Status post orchiectomy.

Left testicle

Measurements: 3.8 x 3.5 x 3.7 cm. No mass or microlithiasis
visualized.

Right epididymis: Status post surgical removal of scrotal contents
on the right.

Left epididymis: There is diffuse enlargement and hyperemia
involving the left epididymis. No focal mass seen in the left
epididymis.

Hydrocele:  Small hydrocele on the left.

Varicocele:  None visualized.

Pulsed Doppler interrogation of both testes demonstrates normal low
resistance arterial and venous waveforms in the left testis. Right
testis absent.
IMPRESSION: Status post prior surgical removal of the scrotal contents on the
right.

Left epididymitis.  Small left pleural effusion.

No intratesticular or extratesticular mass on the left. No
testicular torsion on the left. Left testis is not appear inflamed.

## 2022-06-24 ENCOUNTER — Other Ambulatory Visit: Payer: Self-pay
